# Patient Record
Sex: Male | Born: 1960 | Race: White | Hispanic: No | Marital: Married | Smoking: Never smoker
Health system: Southern US, Community
[De-identification: ages and names within clinical notes are randomized; demographics above are authoritative.]

## PROBLEM LIST (undated history)

## (undated) DIAGNOSIS — K219 Gastro-esophageal reflux disease without esophagitis: Secondary | ICD-10-CM

## (undated) DIAGNOSIS — M1990 Unspecified osteoarthritis, unspecified site: Secondary | ICD-10-CM

## (undated) DIAGNOSIS — R7303 Prediabetes: Secondary | ICD-10-CM

## (undated) DIAGNOSIS — G4730 Sleep apnea, unspecified: Secondary | ICD-10-CM

## (undated) DIAGNOSIS — E785 Hyperlipidemia, unspecified: Secondary | ICD-10-CM

## (undated) HISTORY — DX: Hyperlipidemia, unspecified: E78.5

## (undated) HISTORY — PX: OTHER SURGICAL HISTORY: SHX169

---

## 2009-07-03 ENCOUNTER — Ambulatory Visit: Payer: Self-pay | Admitting: Otolaryngology

## 2010-08-18 ENCOUNTER — Ambulatory Visit: Payer: Self-pay | Admitting: Family Medicine

## 2012-01-07 IMAGING — US SCREENING ULTRASOUND OF ABDOMINAL AORTA
1 series · 18 of 21 positions shown · non-contrast
Comparison: none

REASON FOR EXAM: family history of AAA
COMMENTS:

PROCEDURE:     WECHTETI - WECHTETI EXAM AAA SCREENING  - August 18, 2010  [DATE]
RESULT:     The abdominal aorta is visualized and is normal in caliber. No
aneurysm formation is seen. The common iliac arteries are normal in
appearance.

[Series 1: screening ultrasound of abdominal aorta · 18 of 21 slices shown]
[im 1/21]
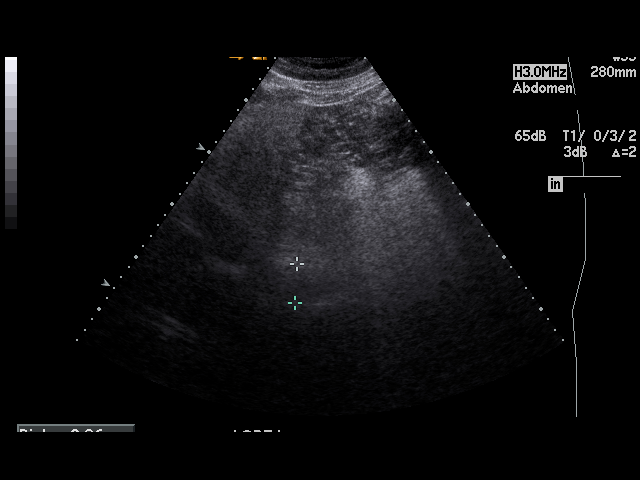
[im 2/21]
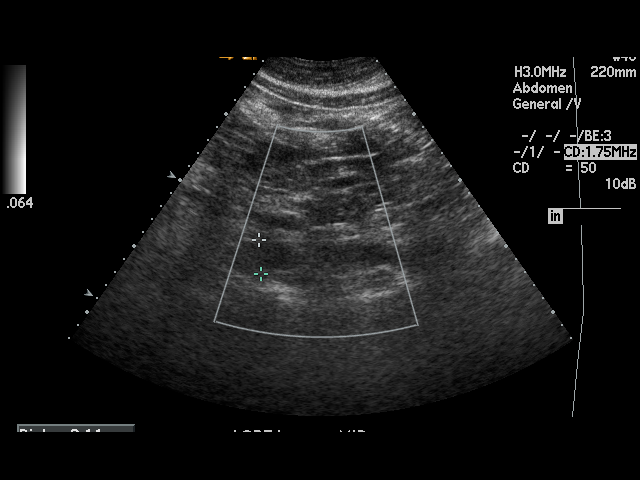
[im 3/21]
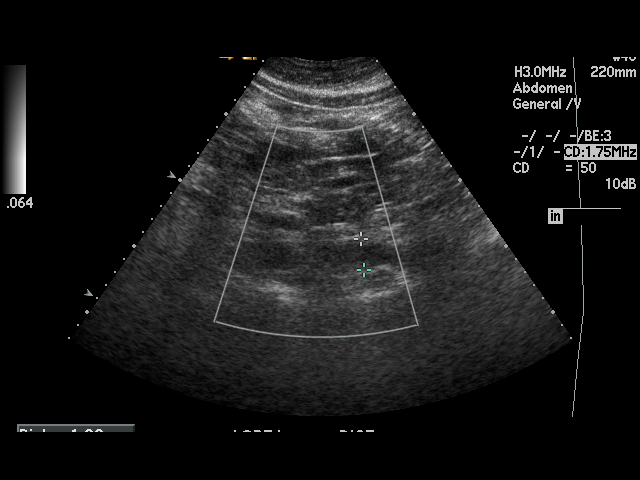
[im 5/21]
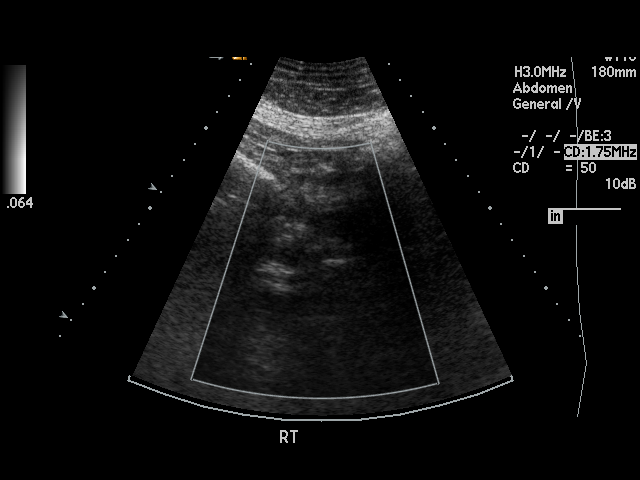
[im 6/21]
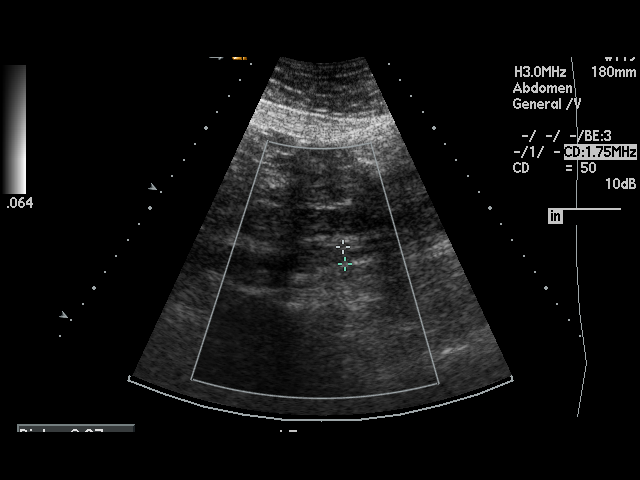
[im 7/21]
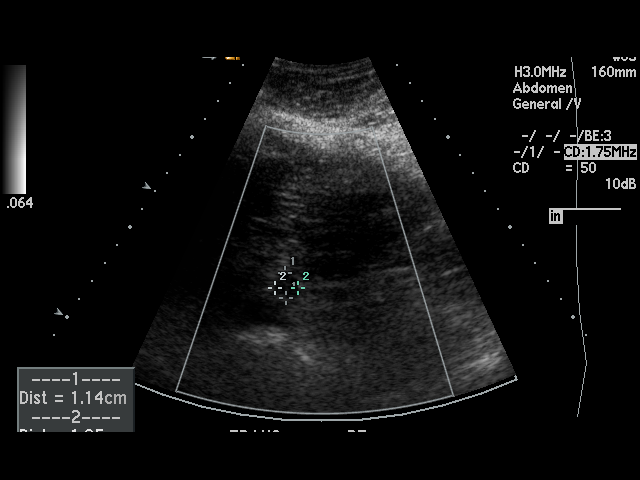
[im 8/21]
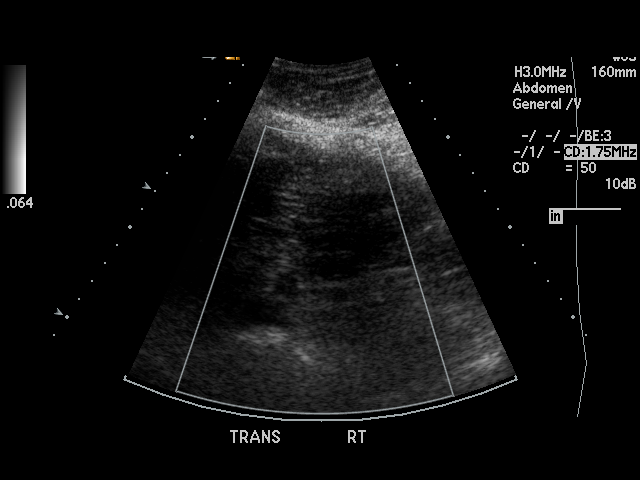
[im 9/21]
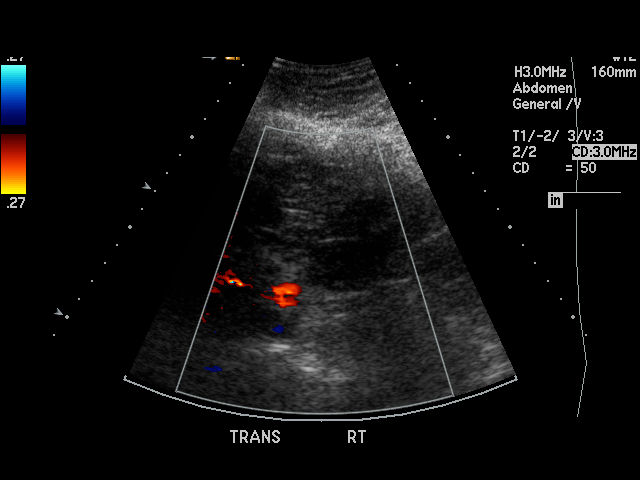
[im 10/21]
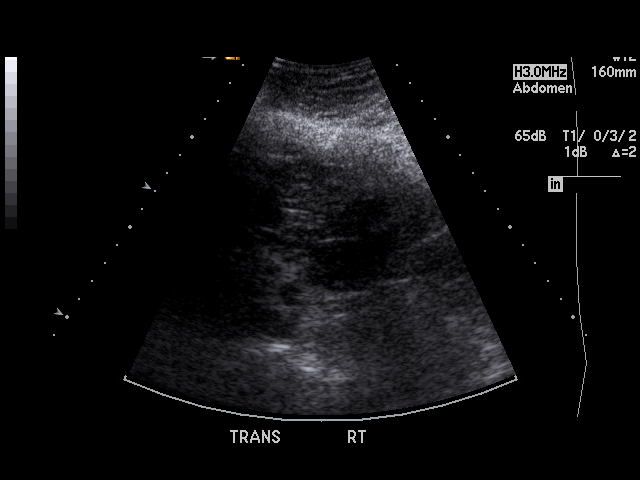
[im 12/21]
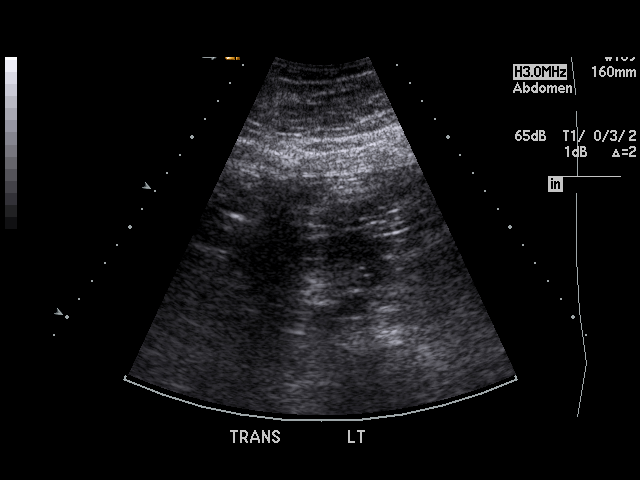
[im 13/21]
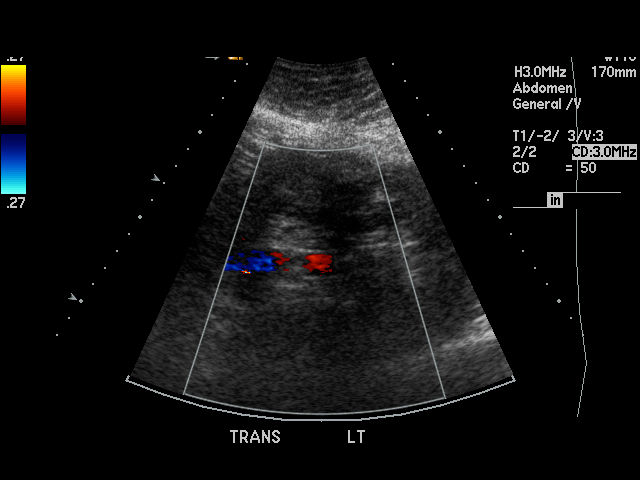
[im 14/21]
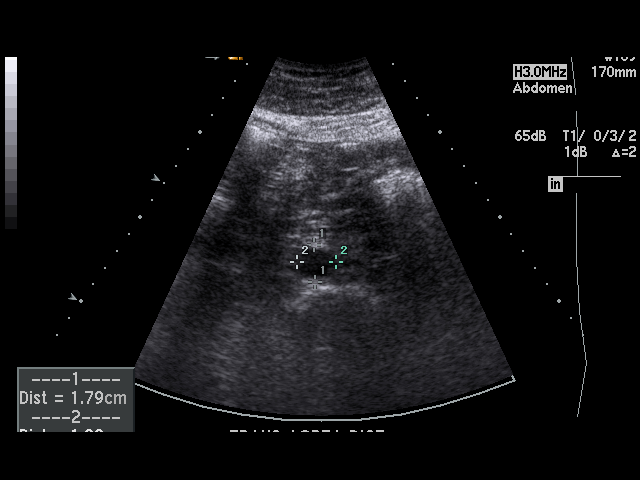
[im 15/21]
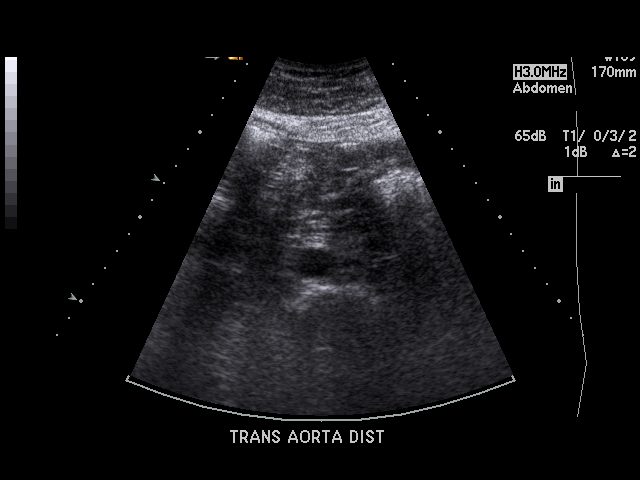
[im 16/21]
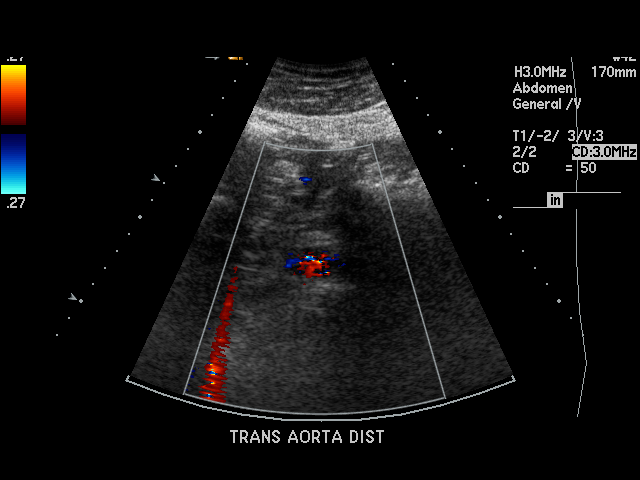
[im 17/21]
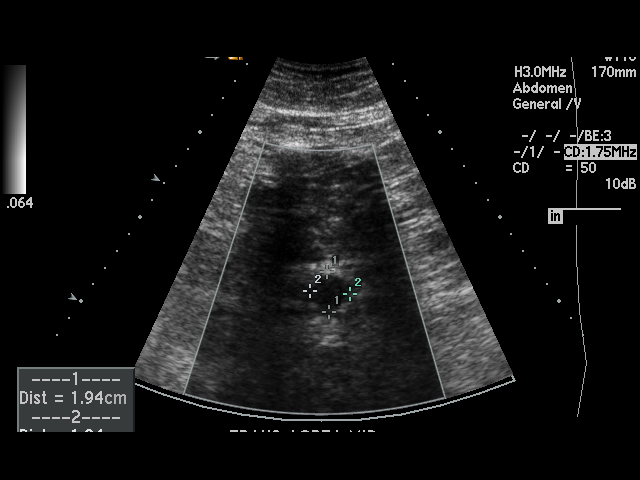
[im 19/21]
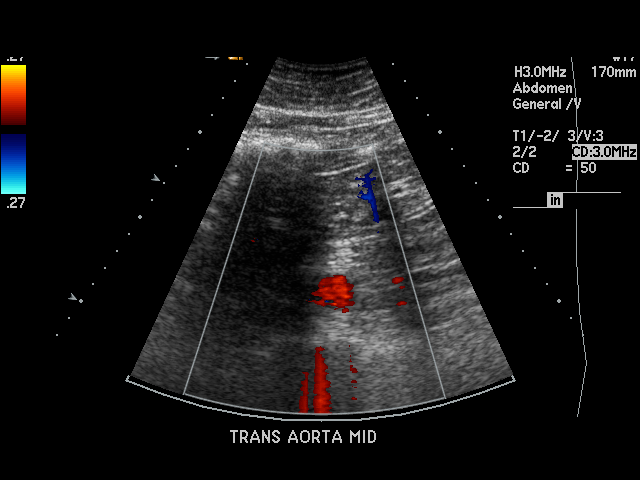
[im 20/21]
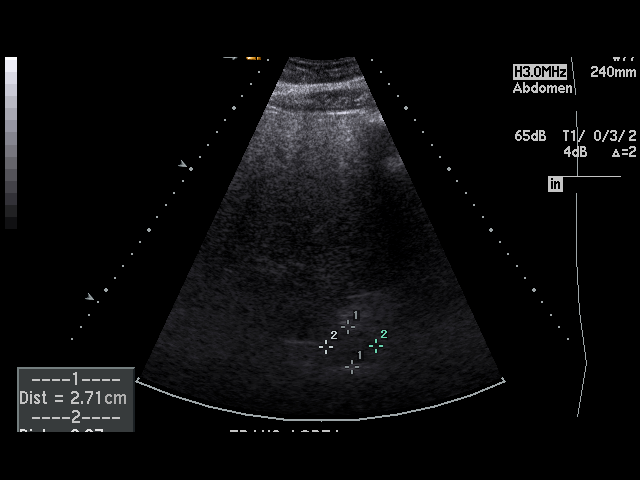
[im 21/21]
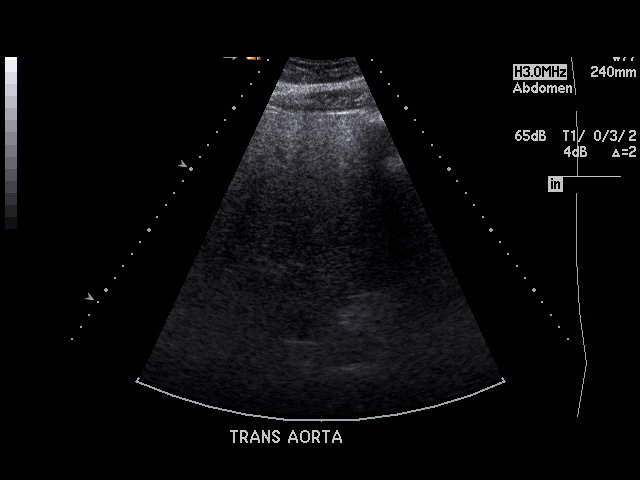

[18 of 21 positions shown; findings below may reference images not displayed]

IMPRESSION: No aneurysm or other significant abnormalities identified.

## 2013-08-16 DIAGNOSIS — R972 Elevated prostate specific antigen [PSA]: Secondary | ICD-10-CM | POA: Insufficient documentation

## 2013-08-18 DIAGNOSIS — E291 Testicular hypofunction: Secondary | ICD-10-CM | POA: Insufficient documentation

## 2013-08-18 DIAGNOSIS — N400 Enlarged prostate without lower urinary tract symptoms: Secondary | ICD-10-CM | POA: Insufficient documentation

## 2014-11-11 ENCOUNTER — Other Ambulatory Visit: Payer: Self-pay | Admitting: Physician Assistant

## 2014-11-11 ENCOUNTER — Ambulatory Visit
Admission: RE | Admit: 2014-11-11 | Discharge: 2014-11-11 | Disposition: A | Discharge status: Home / Self Care | Payer: BLUE CROSS/BLUE SHIELD | Source: Ambulatory Visit | Attending: Physician Assistant | Admitting: Physician Assistant

## 2014-11-11 DIAGNOSIS — R05 Cough: Secondary | ICD-10-CM

## 2014-11-11 DIAGNOSIS — R059 Cough, unspecified: Secondary | ICD-10-CM

## 2015-08-10 ENCOUNTER — Other Ambulatory Visit: Payer: Self-pay | Admitting: Physician Assistant

## 2015-09-12 ENCOUNTER — Ambulatory Visit
Admission: RE | Admit: 2015-09-12 | Discharge: 2015-09-12 | Disposition: A | Discharge status: Home / Self Care | Payer: BLUE CROSS/BLUE SHIELD | Source: Ambulatory Visit | Attending: Family Medicine | Admitting: Family Medicine

## 2015-09-12 ENCOUNTER — Other Ambulatory Visit: Payer: Self-pay | Admitting: Family Medicine

## 2015-09-12 DIAGNOSIS — M19011 Primary osteoarthritis, right shoulder: Secondary | ICD-10-CM | POA: Insufficient documentation

## 2015-09-12 DIAGNOSIS — M25511 Pain in right shoulder: Secondary | ICD-10-CM

## 2016-02-13 ENCOUNTER — Encounter: Payer: Self-pay | Admitting: *Deleted

## 2016-02-16 ENCOUNTER — Ambulatory Visit: Payer: BLUE CROSS/BLUE SHIELD | Admitting: Anesthesiology

## 2016-02-16 ENCOUNTER — Ambulatory Visit
Admission: RE | Admit: 2016-02-16 | Discharge: 2016-02-16 | Disposition: A | Discharge status: Home / Self Care | Payer: BLUE CROSS/BLUE SHIELD | Source: Ambulatory Visit | Attending: Gastroenterology | Admitting: Gastroenterology

## 2016-02-16 ENCOUNTER — Encounter: Payer: Self-pay | Admitting: *Deleted

## 2016-02-16 ENCOUNTER — Encounter
Admission: RE | Disposition: A | Discharge status: Home / Self Care | Payer: Self-pay | Source: Ambulatory Visit | Attending: Gastroenterology

## 2016-02-16 DIAGNOSIS — K5730 Diverticulosis of large intestine without perforation or abscess without bleeding: Secondary | ICD-10-CM | POA: Diagnosis not present

## 2016-02-16 DIAGNOSIS — K640 First degree hemorrhoids: Secondary | ICD-10-CM | POA: Insufficient documentation

## 2016-02-16 DIAGNOSIS — Z79899 Other long term (current) drug therapy: Secondary | ICD-10-CM | POA: Insufficient documentation

## 2016-02-16 DIAGNOSIS — Z7982 Long term (current) use of aspirin: Secondary | ICD-10-CM | POA: Diagnosis not present

## 2016-02-16 DIAGNOSIS — G4730 Sleep apnea, unspecified: Secondary | ICD-10-CM | POA: Insufficient documentation

## 2016-02-16 DIAGNOSIS — K219 Gastro-esophageal reflux disease without esophagitis: Secondary | ICD-10-CM | POA: Insufficient documentation

## 2016-02-16 DIAGNOSIS — Z1211 Encounter for screening for malignant neoplasm of colon: Secondary | ICD-10-CM | POA: Diagnosis present

## 2016-02-16 HISTORY — DX: Sleep apnea, unspecified: G47.30

## 2016-02-16 HISTORY — DX: Gastro-esophageal reflux disease without esophagitis: K21.9

## 2016-02-16 HISTORY — PX: COLONOSCOPY WITH PROPOFOL: SHX5780

## 2016-02-16 SURGERY — COLONOSCOPY WITH PROPOFOL
Anesthesia: General

## 2016-02-16 MED ORDER — PROPOFOL 500 MG/50ML IV EMUL
INTRAVENOUS | Status: DC | PRN
  Administered 2016-02-16: 125 ug/kg/min via INTRAVENOUS

## 2016-02-16 MED ORDER — MIDAZOLAM HCL 2 MG/2ML IJ SOLN
INTRAMUSCULAR | Status: DC | PRN
  Administered 2016-02-16: 1 mg via INTRAVENOUS

## 2016-02-16 MED ORDER — PROPOFOL 10 MG/ML IV BOLUS
INTRAVENOUS | Status: DC | PRN
  Administered 2016-02-16: 10 mg via INTRAVENOUS
  Administered 2016-02-16: 40 mg via INTRAVENOUS
  Administered 2016-02-16: 50 mg via INTRAVENOUS

## 2016-02-16 MED ORDER — SODIUM CHLORIDE 0.9 % IV SOLN
INTRAVENOUS | Status: DC
  Administered 2016-02-16: 1000 mL via INTRAVENOUS

## 2016-02-16 MED ORDER — SODIUM CHLORIDE 0.9 % IV SOLN: INTRAVENOUS | Status: DC

## 2016-02-16 NOTE — Transfer of Care (Signed)
Immediate Anesthesia Transfer of Care Note  Patient: Jonathon Rennis PettyO Balash Jr.  Procedure(s) Performed: Procedure(s): COLONOSCOPY WITH PROPOFOL (N/A)  Patient Location: PACU and Endoscopy Unit  Anesthesia Type:General  Level of Consciousness: awake, alert  and oriented  Airway & Oxygen Therapy: Patient Spontanous Breathing and Patient connected to nasal cannula oxygen  Post-op Assessment: Report given to RN and Post -op Vital signs reviewed and stable  Post vital signs: Reviewed and stable  Last Vitals:  Vitals:   02/16/16 1618 02/16/16 1619  BP: 114/67 114/67  Pulse: 90 89  Resp: 16 (!) 22  Temp: (!) 35.9 C     Last Pain:  Vitals:   02/16/16 1618  TempSrc: Tympanic         Complications: No apparent anesthesia complications

## 2016-02-16 NOTE — H&P (Signed)
Outpatient short stay form Pre-procedure 02/16/2016 3:45 PM Jonathon Evans Jonathon Knock MD  Primary Physician: Dr. Tarri AbernethyJoseph Rabinowitz  Reason for visit:  Screening colonoscopy  History of present illness:  Patient is a 55 year old male presenting today for colonoscopy. His last one was in 2003 with negative findings. He does take a daily 81 mg aspirin but has held that for several days. He takes no other aspirin products or blood thinning agents.    Current Facility-Administered Medications:  .  0.9 %  sodium chloride infusion, , Intravenous, Continuous, Jonathon Evans Cesare Sumlin, MD, Last Rate: 20 mL/hr at 02/16/16 1356, 1,000 mL at 02/16/16 1356 .  0.9 %  sodium chloride infusion, , Intravenous, Continuous, Jonathon Evans Lavaun Greenfield, MD  Prescriptions Prior to Admission  Medication Sig Dispense Refill Last Dose  . allopurinol (ZYLOPRIM) 300 MG tablet Take 300 mg by mouth daily.   02/15/2016 at Unknown time  . aspirin EC 81 MG tablet Take 81 mg by mouth daily.   Past Week at Unknown time  . Cholecalciferol (VITAMIN D3) 2000 units TABS Take by mouth.   Past Week at Unknown time  . GLUCOSAMINE-FISH OIL-EPA-DHA PO Take by mouth.   Past Week at Unknown time  . meloxicam (MOBIC) 15 MG tablet Take 15 mg by mouth daily.   Past Week at Unknown time  . Multiple Vitamin (MULTIVITAMIN) tablet Take 1 tablet by mouth daily.   Past Week at Unknown time  . omeprazole (PRILOSEC) 20 MG capsule Take 20 mg by mouth daily.   02/15/2016 at Unknown time  . rosuvastatin (CRESTOR) 10 MG tablet Take 10 mg by mouth daily.   02/15/2016 at Unknown time  . vitamin B-12 (CYANOCOBALAMIN) 1000 MCG tablet Take 1,000 mcg by mouth daily.   Past Week at Unknown time     No Known Allergies   Past Medical History:  Diagnosis Date  . GERD (gastroesophageal reflux disease)   . Sleep apnea     Review of systems:      Physical Exam    Heart and lungs: Regular rate and rhythm without rub or gallop, lungs are bilaterally clear.    HEENT:  Normocephalic atraumatic eyes are anicteric    Other:     Pertinant exam for procedure: Soft nontender nondistended bowel sounds positive normoactive.     Jonathon DeemSKULSKIE, [name], MD  3:46 PM 02/16/2016    Jonathon Evans [name], MD Gastroenterology 02/16/2016  3:45 PM

## 2016-02-16 NOTE — Anesthesia Preprocedure Evaluation (Signed)
Anesthesia Evaluation  Patient identified by MRN, date of birth, ID band Patient awake    Reviewed: Allergy & Precautions, NPO status , Patient's Chart, lab work & pertinent test results  History of Anesthesia Complications Negative for: history of anesthetic complications  Airway Mallampati: II       Dental   Pulmonary sleep apnea and Continuous Positive Airway Pressure Ventilation ,           Cardiovascular negative cardio ROS       Neuro/Psych    GI/Hepatic Neg liver ROS, GERD  Medicated and Controlled,  Endo/Other  negative endocrine ROS  Renal/GU negative Renal ROS     Musculoskeletal   Abdominal   Peds  Hematology negative hematology ROS (+)   Anesthesia Other Findings   Reproductive/Obstetrics                             Anesthesia Physical Anesthesia Plan  ASA: III  Anesthesia Plan: General   Post-op Pain Management:    Induction: Intravenous  Airway Management Planned: Nasal Cannula  Additional Equipment:   Intra-op Plan:   Post-operative Plan:   Informed Consent: I have reviewed the patients History and Physical, chart, labs and discussed the procedure including the risks, benefits and alternatives for the proposed anesthesia with the patient or authorized representative who has indicated his/her understanding and acceptance.     Plan Discussed with:   Anesthesia Plan Comments:         Anesthesia Quick Evaluation

## 2016-02-16 NOTE — Anesthesia Postprocedure Evaluation (Signed)
Anesthesia Post Note  Patient: Jonathon Rennis PettyO Thielman Jr.  Procedure(s) Performed: Procedure(s) (LRB): COLONOSCOPY WITH PROPOFOL (N/A)  Patient location during evaluation: Endoscopy Anesthesia Type: General Level of consciousness: awake and alert Pain management: pain level controlled Vital Signs Assessment: post-procedure vital signs reviewed and stable Respiratory status: spontaneous breathing, nonlabored ventilation, respiratory function stable and patient connected to nasal cannula oxygen Cardiovascular status: blood pressure returned to baseline and stable Postop Assessment: no signs of nausea or vomiting Anesthetic complications: no    Last Vitals:  Vitals:   02/16/16 1628 02/16/16 1638  BP: 97/76 116/90  Pulse: 77 71  Resp: 19 (!) 21  Temp:      Last Pain:  Vitals:   02/16/16 1618  TempSrc: Tympanic                 [name]

## 2016-02-16 NOTE — Op Note (Signed)
[facility] Gastroenterology Patient Name: Jonathon BodoRex Rhine Procedure Date: 02/16/2016 3:42 PM MRN: [id number] Account #: [id number] Date of Birth: 10/13/1960 Admit Type: Outpatient Age: [age] Room: [facility] ENDO ROOM 1 Gender: Male Note Status: Finalized Procedure:            Colonoscopy Indications:          Screening for colorectal malignant neoplasm Providers:            Christena DeemMartin U. Skulskie, MD Referring MD:         No Local Md, MD (Referring MD) Medicines:            Monitored Anesthesia Care Complications:        No immediate complications. Procedure:            Pre-Anesthesia Assessment:                       - ASA Grade Assessment: III - A patient with severe                        systemic disease.                       After obtaining informed consent, the colonoscope was                        passed under direct vision. Throughout the procedure,                        the patient's blood pressure, pulse, and oxygen                        saturations were monitored continuously. The                        Colonoscope was introduced through the anus and                        advanced to the the cecum, identified by appendiceal                        orifice and ileocecal valve. The colonoscopy was                        performed without difficulty. The patient tolerated the                        procedure well. The quality of the bowel preparation                        was good. Findings:      A few small-mouthed diverticula were found in the sigmoid colon.      Non-bleeding internal hemorrhoids were found during retroflexion and       during anoscopy. The hemorrhoids were small and Grade I (internal       hemorrhoids that do not prolapse).      No additional abnormalities were found on retroflexion.      The digital rectal exam was normal. Impression:           - Diverticulosis in the sigmoid colon.                       -  Non-bleeding internal hemorrhoids.                      - No specimens collected. Recommendation:       - Discharge patient to home.                       - Advance diet as tolerated.                       - Repeat colonoscopy in 10 years for screening purposes. Procedure Code(s):    --- Professional ---                       [id number], Colonoscopy, flexible; diagnostic, including                        collection of specimen(s) by brushing or washing, when                        performed (separate procedure) Diagnosis Code(s):    --- Professional ---                       Z12.11, Encounter for screening for malignant neoplasm                        of colon                       K64.0, First degree hemorrhoids                       K57.30, Diverticulosis of large intestine without                        perforation or abscess without bleeding CPT copyright 2016 American Medical Association. All rights reserved. The codes documented in this report are preliminary and upon coder review may  be revised to meet current compliance requirements. Christena DeemMartin U Skulskie, MD 02/16/2016 4:15:00 PM This report has been signed electronically. Number of Addenda: 0 Note Initiated On: 02/16/2016 3:42 PM Scope Withdrawal Time: 0 hours 10 minutes 13 seconds  Total Procedure Duration: 0 hours 21 minutes 16 seconds       [facility]

## 2016-02-17 ENCOUNTER — Encounter: Payer: Self-pay | Admitting: Gastroenterology

## 2017-01-31 IMAGING — CR DG SHOULDER 2+V*R*
1 series · 3 of 3 positions shown · non-contrast
Comparison: None.

CLINICAL DATA: Right shoulder pain for 6 months with no known
injury

EXAM:
RIGHT SHOULDER - 2+ VIEW

[Series 1: dg shoulder right · 0.14mm/px · 3 of 3 slices shown]
[im 1/3]
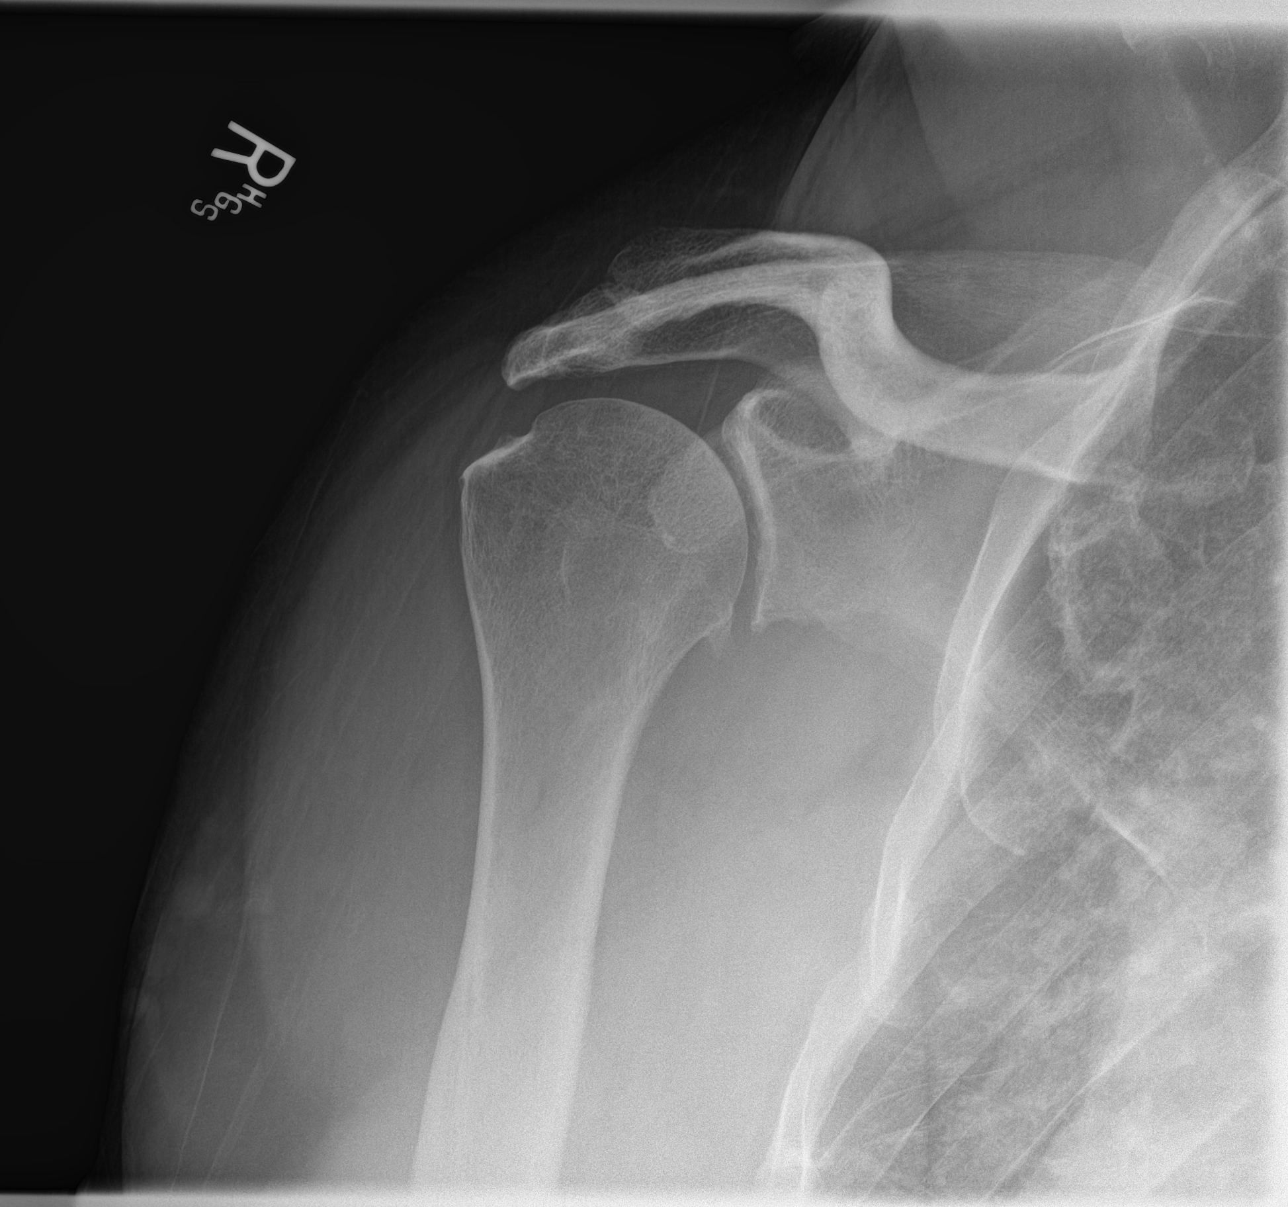
[im 2/3]
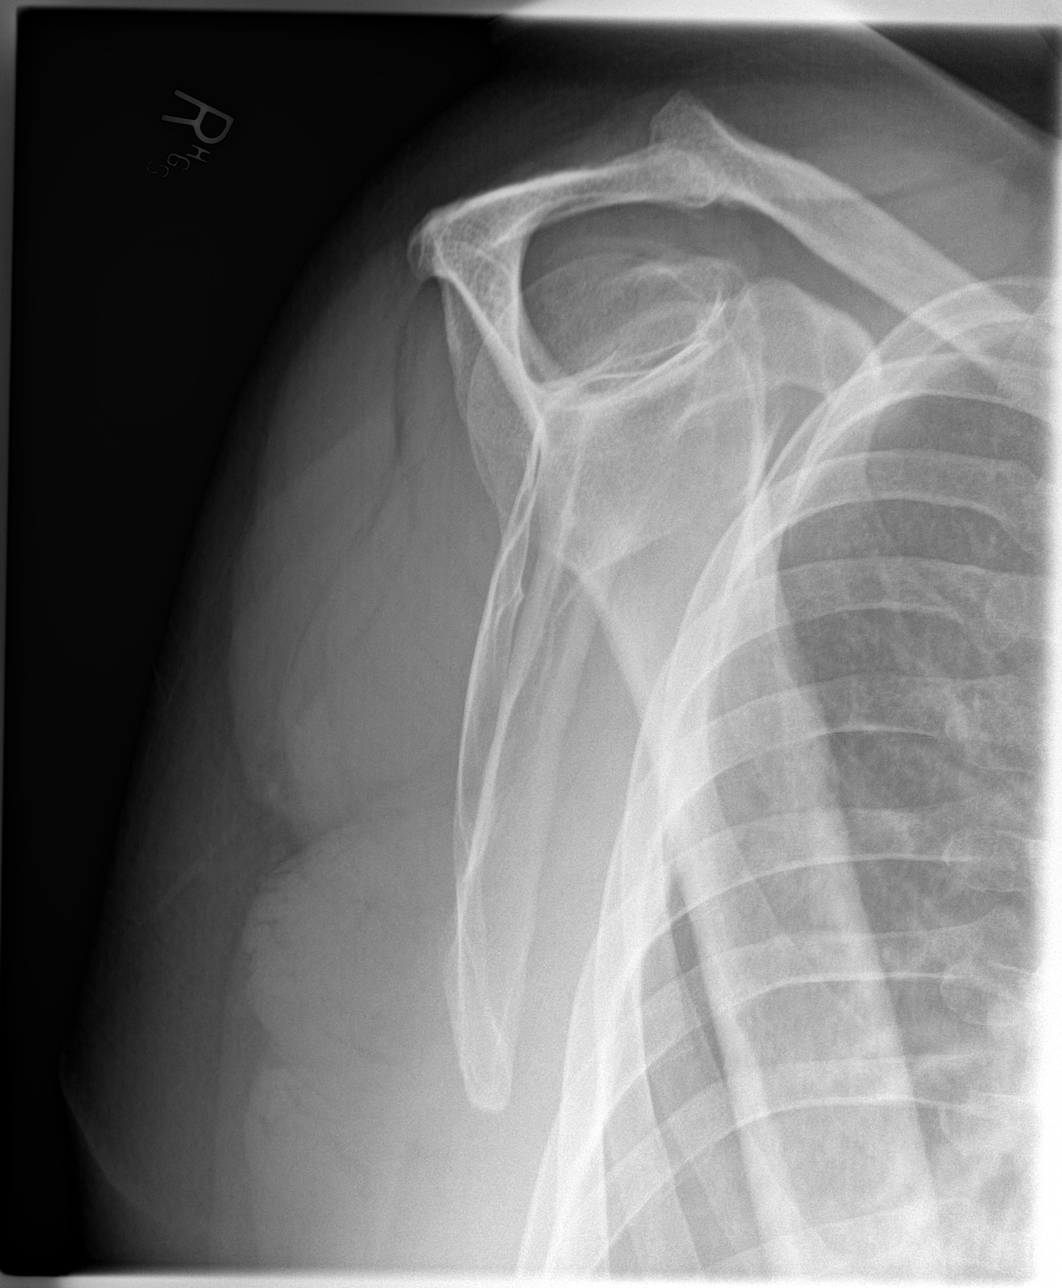
[im 3/3]
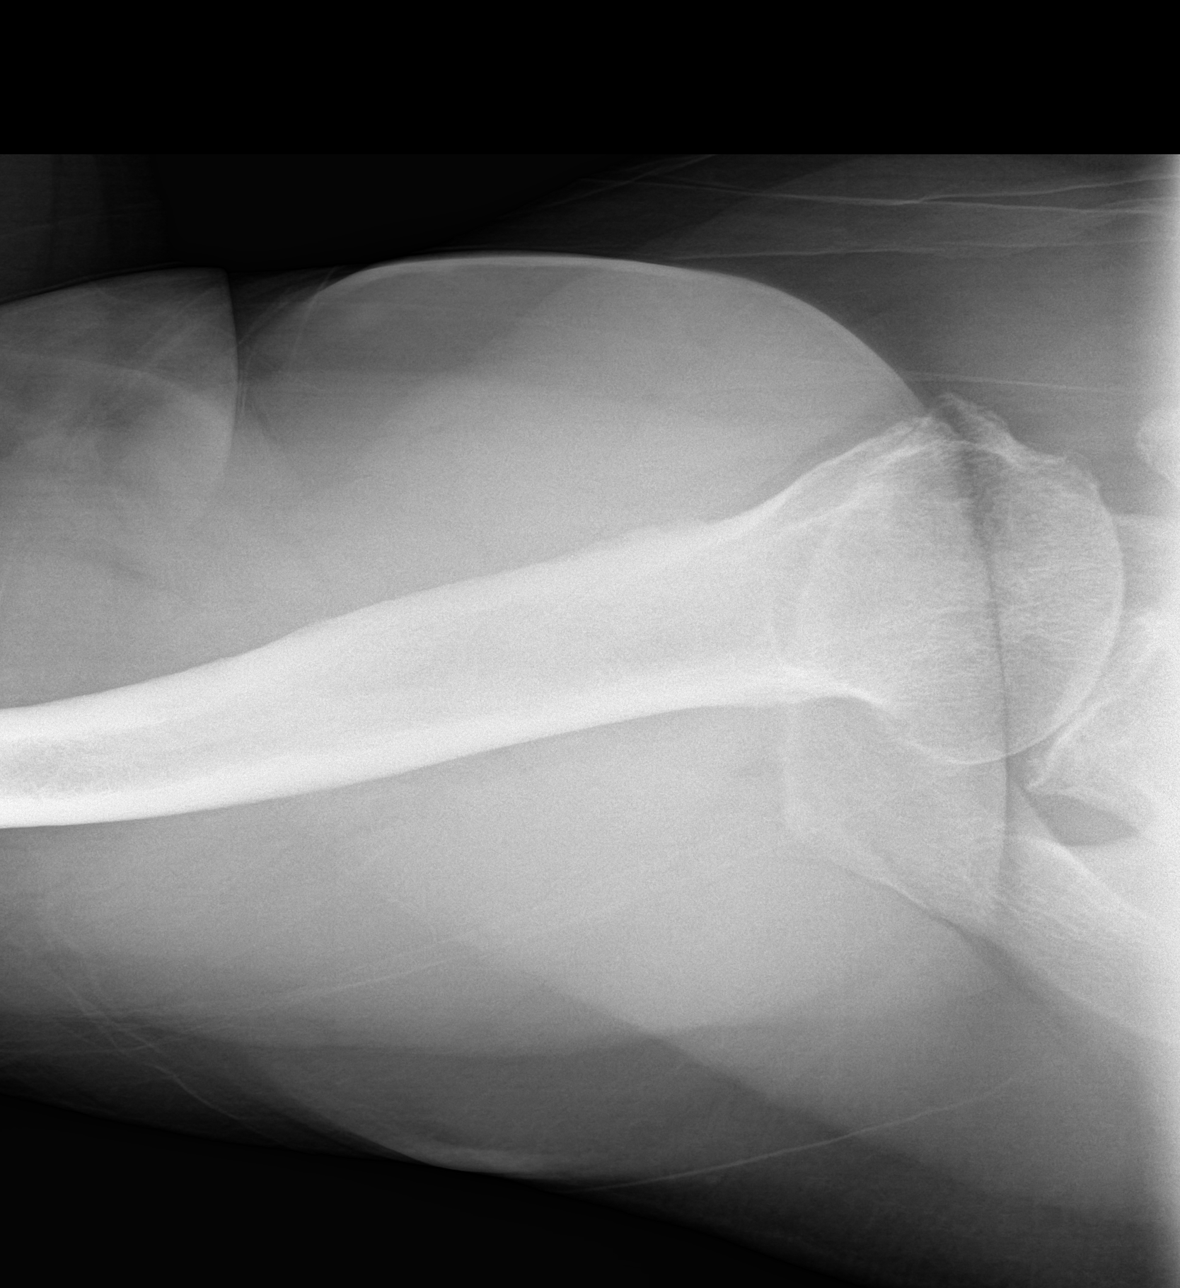

[3 of 3 positions shown; findings below may reference images not displayed]

FINDINGS: Narrowing with osteophyte formation of the glenohumeral joint. No
fracture or dislocation. Mild acromioclavicular joint arthropathy.
IMPRESSION: Glenohumeral arthritis with no acute findings

## 2018-10-28 ENCOUNTER — Other Ambulatory Visit: Payer: Self-pay

## 2018-10-28 ENCOUNTER — Ambulatory Visit
Admission: EM | Admit: 2018-10-28 | Discharge: 2018-10-28 | Disposition: A | Discharge status: Home / Self Care | Payer: 59 | Attending: Family Medicine | Admitting: Family Medicine

## 2018-10-28 DIAGNOSIS — Z20822 Contact with and (suspected) exposure to covid-19: Secondary | ICD-10-CM

## 2018-10-28 DIAGNOSIS — Z20828 Contact with and (suspected) exposure to other viral communicable diseases: Secondary | ICD-10-CM

## 2018-10-28 NOTE — Discharge Instructions (Addendum)
It was very nice seeing you today in clinic. Thank you for entrusting me with your care.   Testing collected today for SARS-CoV-2 (novel coronavirus). Results have been taking as long as 7 days. Someone will contact you with the results. You should self quarantine, per [place] DHSS guidelines, until negative test results have been received.   Make arrangements to follow up with your regular doctor if you develop any concerning symptoms. Please remember, our [place] providers are "right here with you" when you need [place].   Again, it was my pleasure to take care of you today. Thank you for choosing our clinic. I hope that you start to feel better quickly.   Honor Loh, MSN, APRN, FNP-C, CEN Advanced Practice Provider [facility]

## 2018-10-28 NOTE — ED Provider Notes (Signed)
[place], [place]   Name: Jonathon Jonathon PettyO Kostelnik Jr. DOB: [birth year] MRN: [id number] CSN: [id number] PCP: Jonathon Chiquitoabinowitz, Joseph H, MD  Arrival date and time:  10/28/18 1349  Chief Complaint:  COVID testing   NOTE: Prior to seeing the patient today, I have reviewed the triage nursing documentation and vital signs. Clinical staff has updated patient's PMH/PSHx, current medication list, and drug allergies/intolerances to ensure comprehensive history available to assist in medical decision making.   History:   HPI: [place] Jonathon PettyO Schulke Jr. is a 58 y.o. male who presents today with complaints of recent direct exposure to SARS-CoV-2 (novel coronavirus). Known exposure reported to have occurred on/around 10/24/2018. Patient advises that the person he was exposed to was a co-worker [employer] [place] Patient presents today with no symptoms; no cough, fevers, or other symptoms commonly associated with SARS-CoV-2. He advises that he feels generally well. Patient presents for testing out of concern for his personal health. He adds that he is is being required to provide documentation of negative test results before he will be allowed to return to work.    Past Medical History:  Diagnosis Date  . GERD (gastroesophageal reflux disease)   . Sleep apnea     Past Surgical History:  Procedure Laterality Date  . colonoscopy and upper endoscopy    . COLONOSCOPY WITH PROPOFOL N/A 02/16/2016   Procedure: COLONOSCOPY WITH PROPOFOL;  Surgeon: Jonathon DeemMartin U Skulskie, MD;  Location: [facility] ENDOSCOPY;  Service: Endoscopy;  Laterality: N/A;    History reviewed. No pertinent family history.  Social History   Tobacco Use  . Smoking status: Never Smoker  . Smokeless tobacco: Never Used  Substance Use Topics  . Alcohol use: No  . Drug use: No    There are no active problems to display for this patient.   Home Medications:    No outpatient medications have been marked as taking for the 10/28/18 encounter [facility](Hospital Encounter).     Allergies:   Patient has no known allergies.  Review of Systems (ROS): Review of Systems  Constitutional: Negative for fatigue and fever.  HENT: Negative for congestion, ear pain, postnasal drip, rhinorrhea, sinus pressure, sinus pain, sneezing and sore throat.   Eyes: Negative for pain, discharge and redness.  Respiratory: Negative for cough, chest tightness and shortness of breath.   Cardiovascular: Negative for chest pain and palpitations.  Gastrointestinal: Negative for abdominal pain, diarrhea, nausea and vomiting.  Musculoskeletal: Negative for arthralgias, back pain, myalgias and neck pain.  Skin: Negative for color change, pallor and rash.  Neurological: Negative for dizziness, syncope, weakness and headaches.  Hematological: Negative for adenopathy.     Vital Signs: Today's Vitals   10/28/18 1401 10/28/18 1404  BP: (!) 146/102   Pulse: 97   Resp: 18   Temp: 98.2 F (36.8 C)   TempSrc: Oral   SpO2: 98%   Weight:  (!) 330 lb (149.7 kg)  Height:  6' (1.829 m)  PainSc:  0-No pain    Physical Exam: Physical Exam  Constitutional: He is oriented to person, place, and time and well-developed, well-nourished, and in no distress.  HENT:  Head: Normocephalic and atraumatic.  Nose: Nose normal.  Mouth/Throat: Oropharynx is clear and moist and mucous membranes are normal.  Eyes: Pupils are equal, round, and reactive to light. EOM are normal.  Cardiovascular: Normal rate, regular rhythm, normal heart sounds and intact distal pulses. Exam reveals no gallop and no friction rub.  No murmur heard. Pulmonary/Chest: Effort normal and breath sounds  normal. No respiratory distress. He has no wheezes. He has no rales.  Neurological: He is alert and oriented to person, place, and time. Gait normal. GCS score is 15.  Skin: Skin is warm and dry. No rash noted.  Psychiatric: Mood, memory, affect and judgment normal.  Nursing note and vitals reviewed.   Urgent Care Treatments /  Results:   LABS: PLEASE NOTE: all labs that were ordered this encounter are listed, however only abnormal results are displayed. Labs Reviewed  NOVEL CORONAVIRUS, NAA (HOSPITAL ORDER, SEND-OUT TO REF LAB)    EKG: -None  RADIOLOGY: No results found.  PROCEDURES: Procedures  MEDICATIONS RECEIVED THIS VISIT: Medications - No data to display  PERTINENT CLINICAL COURSE NOTES/UPDATES:   Initial Impression / Assessment and Plan / Urgent Care Course:  Pertinent labs & imaging results that were available during my care of the patient were personally reviewed by me and considered in my medical decision making (see lab/imaging section of note for values and interpretations).  Jonathon Jonathon PettyO Lollis Jr. is a 58 y.o. male who [facility] today with complaints of COVID testing   Patient is well appearing overall in clinic today. He does not appear to be in any acute distress. Presenting symptoms (see HPI) and exam as documented above. Patient overall well appearing and in no acute distress today in clinic. Presenting symptoms (see HPI) and exam as documented above. He presents following a direct exposure to SARS-CoV-2 (novel coronavirus). Discussed typical symptom constellation. Reviewed potential for infection with recent close contact. Given exposure and potential for infection, testing is reasonable. Patient collected SARS-CoV-2 via facility approved self swab process today under the supervision of certified clinical staff. Discussed variable turn around times associated with testing, as swabs are being processed at [facility], and have been taking as long as 7 days. He was advised to self quarantine, per [facility] DHHS guidelines, until negative results received.    Discussed follow up with primary care physician should he develop any concerning symptoms. I have reviewed the follow up and strict return precautions for any new or worsening symptoms. Patient is aware of symptoms that would be deemed  urgent/emergent, and would thus require further evaluation either here or in the emergency department. At the time of discharge, he verbalized understanding and consent with the discharge plan as it was reviewed with him. All questions were fielded by provider and/or clinic staff prior to patient discharge.    Final Clinical Impressions / Urgent Care Diagnoses:   Final diagnoses:  Exposure to Covid-19 Virus    New Prescriptions:   Controlled Substance Registry consulted? Not Applicable  No orders of the defined types were placed in this encounter.   Recommended Follow up Care:  Patient encouraged to follow up with the following provider within the specified time frame, or sooner as dictated by the severity of his symptoms. As always, he was instructed that for any urgent/emergent care needs, he should seek care either here or in the emergency department for more immediate evaluation.  Follow-up Information    Jonathon Chiquitoabinowitz, Joseph H, MD.   Specialty: Family Medicine Why: As needed if your develop any concerning symptoms. Contact information: [address] [place] [id number] [phone]         NOTE: This note was prepared using Dragon dictation software along with smaller phrase technology. Despite my best ability to proofread, there is the potential that transcriptional errors may still occur from this process, and are completely unintentional.  Karen Kitchens, NP 10/29/18 [id number]

## 2018-10-28 NOTE — ED Triage Notes (Signed)
Pt reports he works for [employer] and an employee tested positive for [place] beginning of this week. No sx. Pt needs to be tested before returning to work

## 2018-10-30 ENCOUNTER — Other Ambulatory Visit: Payer: Self-pay

## 2018-10-30 LAB — NOVEL CORONAVIRUS, NAA (HOSP ORDER, SEND-OUT TO REF LAB; TAT 18-24 HRS): SARS-CoV-2, NAA: NOT DETECTED

## 2018-10-30 MED ORDER — MELOXICAM 15 MG PO TABS: 15.0000 mg | ORAL_TABLET | Freq: Every day | ORAL | 3 refills | Status: DC

## 2018-12-07 ENCOUNTER — Other Ambulatory Visit: Payer: Self-pay

## 2018-12-07 MED ORDER — OMEPRAZOLE 20 MG PO CPDR: 20.0000 mg | DELAYED_RELEASE_CAPSULE | Freq: Every day | ORAL | 1 refills | Status: DC

## 2018-12-29 DIAGNOSIS — H43811 Vitreous degeneration, right eye: Secondary | ICD-10-CM | POA: Diagnosis not present

## 2019-01-01 DIAGNOSIS — G4733 Obstructive sleep apnea (adult) (pediatric): Secondary | ICD-10-CM | POA: Diagnosis not present

## 2019-01-11 ENCOUNTER — Ambulatory Visit: Payer: Self-pay

## 2019-01-11 DIAGNOSIS — Z23 Encounter for immunization: Secondary | ICD-10-CM

## 2019-01-23 DIAGNOSIS — H43811 Vitreous degeneration, right eye: Secondary | ICD-10-CM | POA: Diagnosis not present

## 2019-01-25 DIAGNOSIS — H25041 Posterior subcapsular polar age-related cataract, right eye: Secondary | ICD-10-CM | POA: Diagnosis not present

## 2019-01-25 DIAGNOSIS — G4733 Obstructive sleep apnea (adult) (pediatric): Secondary | ICD-10-CM | POA: Diagnosis not present

## 2019-02-05 ENCOUNTER — Encounter: Payer: Self-pay | Admitting: *Deleted

## 2019-02-05 ENCOUNTER — Other Ambulatory Visit: Payer: Self-pay

## 2019-02-08 ENCOUNTER — Other Ambulatory Visit
Admission: RE | Admit: 2019-02-08 | Discharge: 2019-02-08 | Disposition: A | Discharge status: Home / Self Care | Payer: 59 | Source: Ambulatory Visit | Attending: Ophthalmology | Admitting: Ophthalmology

## 2019-02-08 DIAGNOSIS — Z20828 Contact with and (suspected) exposure to other viral communicable diseases: Secondary | ICD-10-CM | POA: Insufficient documentation

## 2019-02-08 DIAGNOSIS — Z01812 Encounter for preprocedural laboratory examination: Secondary | ICD-10-CM | POA: Insufficient documentation

## 2019-02-08 LAB — SARS CORONAVIRUS 2 (TAT 6-24 HRS): SARS Coronavirus 2: NEGATIVE

## 2019-02-08 NOTE — Discharge Instructions (Signed)

## 2019-02-12 ENCOUNTER — Ambulatory Visit: Payer: 59 | Admitting: Anesthesiology

## 2019-02-12 ENCOUNTER — Other Ambulatory Visit: Payer: Self-pay

## 2019-02-12 ENCOUNTER — Encounter
Admission: RE | Disposition: A | Discharge status: Home / Self Care | Payer: Self-pay | Source: Home / Self Care | Attending: Ophthalmology

## 2019-02-12 ENCOUNTER — Ambulatory Visit
Admission: RE | Admit: 2019-02-12 | Discharge: 2019-02-12 | Disposition: A | Discharge status: Home / Self Care | Payer: 59 | Attending: Ophthalmology | Admitting: Ophthalmology

## 2019-02-12 DIAGNOSIS — H25041 Posterior subcapsular polar age-related cataract, right eye: Secondary | ICD-10-CM | POA: Diagnosis not present

## 2019-02-12 DIAGNOSIS — Z6841 Body Mass Index (BMI) 40.0 and over, adult: Secondary | ICD-10-CM | POA: Insufficient documentation

## 2019-02-12 DIAGNOSIS — Z791 Long term (current) use of non-steroidal anti-inflammatories (NSAID): Secondary | ICD-10-CM | POA: Diagnosis not present

## 2019-02-12 DIAGNOSIS — M109 Gout, unspecified: Secondary | ICD-10-CM | POA: Insufficient documentation

## 2019-02-12 DIAGNOSIS — K219 Gastro-esophageal reflux disease without esophagitis: Secondary | ICD-10-CM | POA: Insufficient documentation

## 2019-02-12 DIAGNOSIS — H2511 Age-related nuclear cataract, right eye: Secondary | ICD-10-CM | POA: Diagnosis not present

## 2019-02-12 DIAGNOSIS — Z79899 Other long term (current) drug therapy: Secondary | ICD-10-CM | POA: Insufficient documentation

## 2019-02-12 DIAGNOSIS — G4730 Sleep apnea, unspecified: Secondary | ICD-10-CM | POA: Insufficient documentation

## 2019-02-12 DIAGNOSIS — E7800 Pure hypercholesterolemia, unspecified: Secondary | ICD-10-CM | POA: Insufficient documentation

## 2019-02-12 DIAGNOSIS — Z7982 Long term (current) use of aspirin: Secondary | ICD-10-CM | POA: Diagnosis not present

## 2019-02-12 DIAGNOSIS — H25811 Combined forms of age-related cataract, right eye: Secondary | ICD-10-CM | POA: Diagnosis not present

## 2019-02-12 HISTORY — PX: CATARACT EXTRACTION W/PHACO: SHX586

## 2019-02-12 HISTORY — DX: Unspecified osteoarthritis, unspecified site: M19.90

## 2019-02-12 SURGERY — PHACOEMULSIFICATION, CATARACT, WITH IOL INSERTION
Anesthesia: Monitor Anesthesia Care | Site: Eye | Laterality: Right

## 2019-02-12 MED ORDER — FENTANYL CITRATE (PF) 100 MCG/2ML IJ SOLN
INTRAMUSCULAR | Status: DC | PRN
  Administered 2019-02-12 (×2): 50 ug via INTRAVENOUS

## 2019-02-12 MED ORDER — LIDOCAINE HCL (PF) 2 % IJ SOLN
INTRAOCULAR | Status: DC | PRN
  Administered 2019-02-12: 2 mL via INTRAOCULAR

## 2019-02-12 MED ORDER — SODIUM HYALURONATE 23 MG/ML IO SOLN
INTRAOCULAR | Status: DC | PRN
  Administered 2019-02-12: 0.6 mL via INTRAOCULAR

## 2019-02-12 MED ORDER — LACTATED RINGERS IV SOLN: INTRAVENOUS | Status: DC

## 2019-02-12 MED ORDER — EPINEPHRINE PF 1 MG/ML IJ SOLN
INTRAOCULAR | Status: DC | PRN
  Administered 2019-02-12: 72 mL via OPHTHALMIC

## 2019-02-12 MED ORDER — SODIUM HYALURONATE 10 MG/ML IO SOLN
INTRAOCULAR | Status: DC | PRN
  Administered 2019-02-12: 0.55 mL via INTRAOCULAR

## 2019-02-12 MED ORDER — MIDAZOLAM HCL 2 MG/2ML IJ SOLN
INTRAMUSCULAR | Status: DC | PRN
  Administered 2019-02-12: 2 mg via INTRAVENOUS

## 2019-02-12 MED ORDER — TETRACAINE HCL 0.5 % OP SOLN
1.0000 [drp] | OPHTHALMIC | Status: DC | PRN
  Administered 2019-02-12 (×3): 1 [drp] via OPHTHALMIC

## 2019-02-12 MED ORDER — MOXIFLOXACIN HCL 0.5 % OP SOLN
OPHTHALMIC | Status: DC | PRN
  Administered 2019-02-12: 0.2 mL via OPHTHALMIC

## 2019-02-12 MED ORDER — ARMC OPHTHALMIC DILATING DROPS
1.0000 "application " | OPHTHALMIC | Status: DC | PRN
  Administered 2019-02-12 (×3): 1 via OPHTHALMIC

## 2019-02-12 SURGICAL SUPPLY — 19 items
CANNULA ANT/CHMB 27G (MISCELLANEOUS) ×2 IMPLANT
CANNULA ANT/CHMB 27GA (MISCELLANEOUS) ×6 IMPLANT
DISSECTOR HYDRO NUCLEUS 50X22 (MISCELLANEOUS) ×3 IMPLANT
GLOVE SURG LX 7.5 STRW (GLOVE) ×2
GLOVE SURG LX STRL 7.5 STRW (GLOVE) ×1 IMPLANT
GLOVE SURG SYN 8.5  E (GLOVE) ×2
GLOVE SURG SYN 8.5 E (GLOVE) ×1 IMPLANT
GLOVE SURG SYN 8.5 PF PI (GLOVE) ×1 IMPLANT
GOWN STRL REUS W/ TWL LRG LVL3 (GOWN DISPOSABLE) ×2 IMPLANT
GOWN STRL REUS W/TWL LRG LVL3 (GOWN DISPOSABLE) ×4
LENS IOL TECNIS ITEC 17.5 (Intraocular Lens) ×2 IMPLANT
MARKER SKIN DUAL TIP RULER LAB (MISCELLANEOUS) ×3 IMPLANT
PACK DR. KING ARMS (PACKS) ×3 IMPLANT
PACK EYE AFTER SURG (MISCELLANEOUS) ×3 IMPLANT
PACK OPTHALMIC (MISCELLANEOUS) ×3 IMPLANT
SYR 3ML LL SCALE MARK (SYRINGE) ×3 IMPLANT
SYR TB 1ML LUER SLIP (SYRINGE) ×3 IMPLANT
WATER STERILE IRR 250ML POUR (IV SOLUTION) ×3 IMPLANT
WIPE NON LINTING 3.25X3.25 (MISCELLANEOUS) ×3 IMPLANT

## 2019-02-12 NOTE — Anesthesia Postprocedure Evaluation (Signed)
Anesthesia Post Note  Patient: Jonathon Evans.  Procedure(s) Performed: CATARACT EXTRACTION PHACO AND INTRAOCULAR LENS PLACEMENT (IOC) RIGHT 00:44.3            13.1%          5.86 (Right Eye)     Patient location during evaluation: PACU Anesthesia Type: MAC Level of consciousness: awake and alert Pain management: pain level controlled Vital Signs Assessment: post-procedure vital signs reviewed and stable Respiratory status: spontaneous breathing Cardiovascular status: blood pressure returned to baseline Postop Assessment: no apparent nausea or vomiting, adequate PO intake and no headache Anesthetic complications: no    Adele Barthel Alfonsa Vaile

## 2019-02-12 NOTE — Transfer of Care (Signed)
Immediate Anesthesia Transfer of Care Note  Patient: Jonathon Evans.  Procedure(s) Performed: CATARACT EXTRACTION PHACO AND INTRAOCULAR LENS PLACEMENT (IOC) RIGHT 00:44.3            13.1%          5.86 (Right Eye)  Patient Location: PACU  Anesthesia Type: MAC  Level of Consciousness: awake, alert  and patient cooperative  Airway and Oxygen Therapy: Patient Spontanous Breathing and Patient connected to supplemental oxygen  Post-op Assessment: Post-op Vital signs reviewed, Patient's Cardiovascular Status Stable, Respiratory Function Stable, Patent Airway and No signs of Nausea or vomiting  Post-op Vital Signs: Reviewed and stable  Complications: No apparent anesthesia complications

## 2019-02-12 NOTE — Anesthesia Preprocedure Evaluation (Signed)
Anesthesia Evaluation  Patient identified by MRN, date of birth, ID band Patient awake    History of Anesthesia Complications Negative for: history of anesthetic complications  Airway Mallampati: III  TM Distance: >3 FB Neck ROM: Full    Dental no notable dental hx.    Pulmonary sleep apnea and Continuous Positive Airway Pressure Ventilation ,    Pulmonary exam normal        Cardiovascular Exercise Tolerance: Good negative cardio ROS Normal cardiovascular exam     Neuro/Psych negative neurological ROS  negative psych ROS   GI/Hepatic Neg liver ROS, GERD  ,  Endo/Other  Morbid obesity  Renal/GU negative Renal ROS     Musculoskeletal   Abdominal   Peds  Hematology negative hematology ROS (+)   Anesthesia Other Findings   Reproductive/Obstetrics                             Anesthesia Physical Anesthesia Plan  ASA: III  Anesthesia Plan: MAC   Post-op Pain Management:    Induction: Intravenous  PONV Risk Score and Plan: 1 and Midazolam  Airway Management Planned: Nasal Cannula and Natural Airway  Additional Equipment: None  Intra-op Plan:   Post-operative Plan:   Informed Consent: I have reviewed the patients History and Physical, chart, labs and discussed the procedure including the risks, benefits and alternatives for the proposed anesthesia with the patient or authorized representative who has indicated his/her understanding and acceptance.       Plan Discussed with:   Anesthesia Plan Comments:         Anesthesia Quick Evaluation

## 2019-02-12 NOTE — Op Note (Signed)
OPERATIVE NOTE  Jonathon Evans [id number] 02/12/2019   PREOPERATIVE DIAGNOSIS:  Nuclear sclerotic cataract right eye.  H25.11   POSTOPERATIVE DIAGNOSIS:    Nuclear sclerotic cataract right eye.     PROCEDURE:  Phacoemusification with posterior chamber intraocular lens placement of the right eye   LENS:   Implant Name Type Inv. Item Serial No. Manufacturer Lot No. LRB No. Used Action  LENS IOL DIOP 17.5 - [id number] Intraocular Lens LENS IOL DIOP 17.5 [id number] AMO  Right 1 Implanted       Procedure(s) with comments: CATARACT EXTRACTION PHACO AND INTRAOCULAR LENS PLACEMENT (IOC) RIGHT 00:44.3            13.1%          5.86 (Right) - sleep apnea  PCB00 +17.5   ULTRASOUND TIME: 0 minutes 44 seconds.  CDE 5.86   SURGEON:  Benay Pillow, MD, MPH  ANESTHESIOLOGIST: Anesthesiologist: Page, Adele Barthel, MD CRNA: Jeannene Patella, CRNA   ANESTHESIA:  Topical with tetracaine drops augmented with 1% preservative-free intracameral lidocaine.  ESTIMATED BLOOD LOSS: less than 1 mL.   COMPLICATIONS:  None.   DESCRIPTION OF PROCEDURE:  The patient was identified in the holding room and transported to the operating room and placed in the supine position under the operating microscope.  The right eye was identified as the operative eye and it was prepped and draped in the usual sterile ophthalmic fashion.   A 1.0 millimeter clear-corneal paracentesis was made at the 10:30 position. 0.5 ml of preservative-free 1% lidocaine with epinephrine was injected into the anterior chamber.  The anterior chamber was filled with Healon 5 viscoelastic.  A 2.4 millimeter keratome was used to make a near-clear corneal incision at the 8:00 position.  A curvilinear capsulorrhexis was made with a cystotome and capsulorrhexis forceps.  Balanced salt solution was used to hydrodissect and hydrodelineate the nucleus.   Phacoemulsification was then used in stop and chop fashion to remove the lens nucleus and  epinucleus.  The remaining cortex was then removed using the irrigation and aspiration handpiece. Healon was then placed into the capsular bag to distend it for lens placement.  A lens was then injected into the capsular bag.  The remaining viscoelastic was aspirated.   Wounds were hydrated with balanced salt solution.  The anterior chamber was inflated to a physiologic pressure with balanced salt solution.   Intracameral vigamox 0.1 mL undiluted was injected into the eye and a drop placed onto the ocular surface.  No wound leaks were noted.  The patient was taken to the recovery room in stable condition without complications of anesthesia or surgery  Benay Pillow 02/12/2019, 10:24 AM

## 2019-02-12 NOTE — H&P (Signed)

## 2019-02-13 ENCOUNTER — Encounter: Payer: Self-pay | Admitting: Ophthalmology

## 2019-02-28 ENCOUNTER — Other Ambulatory Visit: Payer: Self-pay

## 2019-02-28 DIAGNOSIS — G8929 Other chronic pain: Secondary | ICD-10-CM

## 2019-02-28 DIAGNOSIS — K219 Gastro-esophageal reflux disease without esophagitis: Secondary | ICD-10-CM

## 2019-02-28 DIAGNOSIS — M25511 Pain in right shoulder: Secondary | ICD-10-CM

## 2019-02-28 MED ORDER — MELOXICAM 15 MG PO TABS: 15.0000 mg | ORAL_TABLET | Freq: Every day | ORAL | 0 refills | Status: DC

## 2019-02-28 NOTE — Telephone Encounter (Signed)
Labs performed 08/29/2017 per paper chart review [facility] renal/liver function and CBC WNL  Last appt with Dr Cheryll Cockayne  08/31/2017.  Patient due for annual labs/physical.  Please contact patient to schedule labs and office visit.  Delayed due to covid pandemic annual visit.  Electronic Rx for mobic 15mg  po daily #30 RF0 sent to his pharmacy of choice.  Shoulder arthritis Rx started by [facility] 2017 had injection also.

## 2019-03-07 ENCOUNTER — Telehealth: Payer: Self-pay

## 2019-03-07 NOTE — Telephone Encounter (Signed)
Wellons, Marinell Blight, RN        Left voice mail no return call   Previous Messages  ----- Message -----  From: Aliene Altes, RN  Sent: 02/28/2019  4:33 PM EST  To: Madelin Headings  Subject: Leal, NP-C renewed Rx refill request.  Requested he come in for labs & physical - last was done with Dr. Corinda Gubler.   AMD

## 2019-03-13 DIAGNOSIS — K219 Gastro-esophageal reflux disease without esophagitis: Secondary | ICD-10-CM | POA: Diagnosis not present

## 2019-03-13 DIAGNOSIS — Z23 Encounter for immunization: Secondary | ICD-10-CM | POA: Diagnosis not present

## 2019-03-13 DIAGNOSIS — Z0000 Encounter for general adult medical examination without abnormal findings: Secondary | ICD-10-CM | POA: Diagnosis not present

## 2019-03-13 DIAGNOSIS — M1A00X0 Idiopathic chronic gout, unspecified site, without tophus (tophi): Secondary | ICD-10-CM | POA: Diagnosis not present

## 2019-03-13 DIAGNOSIS — E785 Hyperlipidemia, unspecified: Secondary | ICD-10-CM | POA: Diagnosis not present

## 2019-03-13 DIAGNOSIS — R972 Elevated prostate specific antigen [PSA]: Secondary | ICD-10-CM | POA: Diagnosis not present

## 2019-03-14 DIAGNOSIS — Z1159 Encounter for screening for other viral diseases: Secondary | ICD-10-CM | POA: Diagnosis not present

## 2019-03-14 DIAGNOSIS — K219 Gastro-esophageal reflux disease without esophagitis: Secondary | ICD-10-CM | POA: Diagnosis not present

## 2019-03-14 DIAGNOSIS — E785 Hyperlipidemia, unspecified: Secondary | ICD-10-CM | POA: Diagnosis not present

## 2019-03-14 DIAGNOSIS — Z125 Encounter for screening for malignant neoplasm of prostate: Secondary | ICD-10-CM | POA: Diagnosis not present

## 2019-11-07 ENCOUNTER — Ambulatory Visit: Payer: Self-pay

## 2019-11-07 ENCOUNTER — Other Ambulatory Visit: Payer: Self-pay

## 2019-11-07 DIAGNOSIS — Z0110 Encounter for examination of ears and hearing without abnormal findings: Secondary | ICD-10-CM

## 2019-11-07 NOTE — Progress Notes (Signed)
Completed annual physical with outside physician.  Presents today for annual hearing screen.  AMD

## 2019-11-20 NOTE — Addendum Note (Signed)
Addended by: Christianne Dolin F on: 11/20/2019 09:50 AM   Modules accepted: Orders

## 2019-11-22 ENCOUNTER — Other Ambulatory Visit: Payer: Self-pay | Admitting: Internal Medicine

## 2019-11-22 DIAGNOSIS — M109 Gout, unspecified: Secondary | ICD-10-CM

## 2020-06-27 ENCOUNTER — Ambulatory Visit: Payer: Self-pay

## 2020-06-27 ENCOUNTER — Other Ambulatory Visit: Payer: Self-pay

## 2020-06-27 DIAGNOSIS — Z0110 Encounter for examination of ears and hearing without abnormal findings: Secondary | ICD-10-CM

## 2020-06-27 NOTE — Progress Notes (Signed)
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 Presents today for annual hearing screen.  AMD

## 2021-01-16 DIAGNOSIS — G4733 Obstructive sleep apnea (adult) (pediatric): Secondary | ICD-10-CM | POA: Diagnosis not present

## 2021-02-16 DIAGNOSIS — G4733 Obstructive sleep apnea (adult) (pediatric): Secondary | ICD-10-CM | POA: Diagnosis not present

## 2021-03-18 DIAGNOSIS — G4733 Obstructive sleep apnea (adult) (pediatric): Secondary | ICD-10-CM | POA: Diagnosis not present

## 2021-03-23 DIAGNOSIS — I10 Essential (primary) hypertension: Secondary | ICD-10-CM | POA: Diagnosis not present

## 2021-03-23 DIAGNOSIS — R739 Hyperglycemia, unspecified: Secondary | ICD-10-CM | POA: Diagnosis not present

## 2021-03-23 DIAGNOSIS — R7303 Prediabetes: Secondary | ICD-10-CM | POA: Diagnosis not present

## 2021-03-23 DIAGNOSIS — G4733 Obstructive sleep apnea (adult) (pediatric): Secondary | ICD-10-CM | POA: Diagnosis not present

## 2021-03-23 DIAGNOSIS — R972 Elevated prostate specific antigen [PSA]: Secondary | ICD-10-CM | POA: Diagnosis not present

## 2021-03-23 DIAGNOSIS — Z0000 Encounter for general adult medical examination without abnormal findings: Secondary | ICD-10-CM | POA: Diagnosis not present

## 2021-03-23 DIAGNOSIS — E785 Hyperlipidemia, unspecified: Secondary | ICD-10-CM | POA: Diagnosis not present

## 2021-04-30 DIAGNOSIS — G4733 Obstructive sleep apnea (adult) (pediatric): Secondary | ICD-10-CM | POA: Diagnosis not present

## 2021-05-31 DIAGNOSIS — G4733 Obstructive sleep apnea (adult) (pediatric): Secondary | ICD-10-CM | POA: Diagnosis not present

## 2021-06-28 DIAGNOSIS — G4733 Obstructive sleep apnea (adult) (pediatric): Secondary | ICD-10-CM | POA: Diagnosis not present

## 2021-07-24 ENCOUNTER — Ambulatory Visit: Payer: Self-pay

## 2021-07-24 DIAGNOSIS — Z0110 Encounter for examination of ears and hearing without abnormal findings: Secondary | ICD-10-CM

## 2021-07-24 NOTE — Progress Notes (Signed)
Presents to [facility] for annual hearing screen.  Works for [employer] Dept & the department is part of the [facility] Hospital doctor. ? ?AMD ?

## 2021-09-21 DIAGNOSIS — I10 Essential (primary) hypertension: Secondary | ICD-10-CM | POA: Diagnosis not present

## 2021-09-21 DIAGNOSIS — E785 Hyperlipidemia, unspecified: Secondary | ICD-10-CM | POA: Diagnosis not present

## 2021-09-21 DIAGNOSIS — G4733 Obstructive sleep apnea (adult) (pediatric): Secondary | ICD-10-CM | POA: Diagnosis not present

## 2021-09-21 DIAGNOSIS — Z6841 Body Mass Index (BMI) 40.0 and over, adult: Secondary | ICD-10-CM | POA: Diagnosis not present

## 2021-09-21 DIAGNOSIS — R7303 Prediabetes: Secondary | ICD-10-CM | POA: Diagnosis not present

## 2021-09-21 DIAGNOSIS — Z713 Dietary counseling and surveillance: Secondary | ICD-10-CM | POA: Diagnosis not present

## 2021-10-29 DIAGNOSIS — G4733 Obstructive sleep apnea (adult) (pediatric): Secondary | ICD-10-CM | POA: Diagnosis not present

## 2022-03-18 DIAGNOSIS — G4733 Obstructive sleep apnea (adult) (pediatric): Secondary | ICD-10-CM | POA: Diagnosis not present

## 2022-03-24 DIAGNOSIS — Z136 Encounter for screening for cardiovascular disorders: Secondary | ICD-10-CM | POA: Diagnosis not present

## 2022-03-24 DIAGNOSIS — R972 Elevated prostate specific antigen [PSA]: Secondary | ICD-10-CM | POA: Diagnosis not present

## 2022-03-24 DIAGNOSIS — Z6841 Body Mass Index (BMI) 40.0 and over, adult: Secondary | ICD-10-CM | POA: Diagnosis not present

## 2022-03-24 DIAGNOSIS — Z0000 Encounter for general adult medical examination without abnormal findings: Secondary | ICD-10-CM | POA: Diagnosis not present

## 2022-03-24 DIAGNOSIS — R7303 Prediabetes: Secondary | ICD-10-CM | POA: Diagnosis not present

## 2022-03-24 DIAGNOSIS — Z8249 Family history of ischemic heart disease and other diseases of the circulatory system: Secondary | ICD-10-CM | POA: Diagnosis not present

## 2022-03-24 DIAGNOSIS — Z713 Dietary counseling and surveillance: Secondary | ICD-10-CM | POA: Diagnosis not present

## 2022-03-24 DIAGNOSIS — G4733 Obstructive sleep apnea (adult) (pediatric): Secondary | ICD-10-CM | POA: Diagnosis not present

## 2022-03-24 DIAGNOSIS — E785 Hyperlipidemia, unspecified: Secondary | ICD-10-CM | POA: Diagnosis not present

## 2022-03-24 DIAGNOSIS — I10 Essential (primary) hypertension: Secondary | ICD-10-CM | POA: Diagnosis not present

## 2022-03-31 ENCOUNTER — Other Ambulatory Visit: Payer: Self-pay | Admitting: Infectious Diseases

## 2022-03-31 DIAGNOSIS — Z8249 Family history of ischemic heart disease and other diseases of the circulatory system: Secondary | ICD-10-CM

## 2022-03-31 DIAGNOSIS — Z136 Encounter for screening for cardiovascular disorders: Secondary | ICD-10-CM

## 2022-05-04 DIAGNOSIS — H40003 Preglaucoma, unspecified, bilateral: Secondary | ICD-10-CM | POA: Diagnosis not present

## 2022-06-09 DIAGNOSIS — H2512 Age-related nuclear cataract, left eye: Secondary | ICD-10-CM | POA: Diagnosis not present

## 2022-07-07 ENCOUNTER — Encounter: Payer: Self-pay | Admitting: Ophthalmology

## 2022-07-15 NOTE — Discharge Instructions (Signed)

## 2022-07-19 ENCOUNTER — Encounter
Admission: RE | Disposition: A | Discharge status: Home / Self Care | Payer: Self-pay | Source: Ambulatory Visit | Attending: Ophthalmology

## 2022-07-19 ENCOUNTER — Encounter: Payer: Self-pay | Admitting: Ophthalmology

## 2022-07-19 ENCOUNTER — Other Ambulatory Visit: Payer: Self-pay

## 2022-07-19 ENCOUNTER — Ambulatory Visit
Admission: RE | Admit: 2022-07-19 | Discharge: 2022-07-19 | Disposition: A | Discharge status: Home / Self Care | Payer: 59 | Source: Ambulatory Visit | Attending: Ophthalmology | Admitting: Ophthalmology

## 2022-07-19 ENCOUNTER — Ambulatory Visit: Payer: 59 | Admitting: Anesthesiology

## 2022-07-19 DIAGNOSIS — R7303 Prediabetes: Secondary | ICD-10-CM | POA: Diagnosis not present

## 2022-07-19 DIAGNOSIS — I4891 Unspecified atrial fibrillation: Secondary | ICD-10-CM | POA: Diagnosis not present

## 2022-07-19 DIAGNOSIS — Z9989 Dependence on other enabling machines and devices: Secondary | ICD-10-CM | POA: Diagnosis not present

## 2022-07-19 DIAGNOSIS — K219 Gastro-esophageal reflux disease without esophagitis: Secondary | ICD-10-CM | POA: Insufficient documentation

## 2022-07-19 DIAGNOSIS — H2512 Age-related nuclear cataract, left eye: Secondary | ICD-10-CM | POA: Diagnosis not present

## 2022-07-19 DIAGNOSIS — Z6841 Body Mass Index (BMI) 40.0 and over, adult: Secondary | ICD-10-CM | POA: Insufficient documentation

## 2022-07-19 DIAGNOSIS — G4730 Sleep apnea, unspecified: Secondary | ICD-10-CM | POA: Insufficient documentation

## 2022-07-19 HISTORY — PX: CATARACT EXTRACTION W/PHACO: SHX586

## 2022-07-19 HISTORY — DX: Prediabetes: R73.03

## 2022-07-19 SURGERY — PHACOEMULSIFICATION, CATARACT, WITH IOL INSERTION
Anesthesia: Monitor Anesthesia Care | Site: Eye | Laterality: Left

## 2022-07-19 MED ORDER — MOXIFLOXACIN HCL 0.5 % OP SOLN
OPHTHALMIC | Status: DC | PRN
  Administered 2022-07-19: .2 mL via OPHTHALMIC

## 2022-07-19 MED ORDER — LACTATED RINGERS IV SOLN: INTRAVENOUS | Status: DC

## 2022-07-19 MED ORDER — TETRACAINE HCL 0.5 % OP SOLN
1.0000 [drp] | OPHTHALMIC | Status: DC | PRN
  Administered 2022-07-19 (×3): 1 [drp] via OPHTHALMIC

## 2022-07-19 MED ORDER — SIGHTPATH DOSE#1 BSS IO SOLN
INTRAOCULAR | Status: DC | PRN
  Administered 2022-07-19: 15 mL

## 2022-07-19 MED ORDER — LIDOCAINE HCL (PF) 2 % IJ SOLN
INTRAOCULAR | Status: DC | PRN
  Administered 2022-07-19: 1 mL via INTRAOCULAR

## 2022-07-19 MED ORDER — FENTANYL CITRATE (PF) 100 MCG/2ML IJ SOLN
INTRAMUSCULAR | Status: DC | PRN
  Administered 2022-07-19: 25 ug via INTRAVENOUS

## 2022-07-19 MED ORDER — ARMC OPHTHALMIC DILATING DROPS
1.0000 | OPHTHALMIC | Status: DC | PRN
  Administered 2022-07-19 (×3): 1 via OPHTHALMIC

## 2022-07-19 MED ORDER — SIGHTPATH DOSE#1 BSS IO SOLN
INTRAOCULAR | Status: DC | PRN
  Administered 2022-07-19: 66 mL via OPHTHALMIC

## 2022-07-19 MED ORDER — MIDAZOLAM HCL 2 MG/2ML IJ SOLN
INTRAMUSCULAR | Status: DC | PRN
  Administered 2022-07-19: 2 mg via INTRAVENOUS

## 2022-07-19 MED ORDER — SIGHTPATH DOSE#1 NA HYALUR & NA CHOND-NA HYALUR IO KIT
PACK | INTRAOCULAR | Status: DC | PRN
  Administered 2022-07-19: 1 via OPHTHALMIC

## 2022-07-19 SURGICAL SUPPLY — 13 items
CATARACT SUITE SIGHTPATH (MISCELLANEOUS) ×1 IMPLANT
DISSECTOR HYDRO NUCLEUS 50X22 (MISCELLANEOUS) ×1 IMPLANT
FEE CATARACT SUITE SIGHTPATH (MISCELLANEOUS) ×1 IMPLANT
GLOVE SURG GAMMEX PI TX LF 7.5 (GLOVE) ×1 IMPLANT
GLOVE SURG SYN 8.5  E (GLOVE) ×1
GLOVE SURG SYN 8.5 E (GLOVE) ×1 IMPLANT
GLOVE SURG SYN 8.5 PF PI (GLOVE) ×1 IMPLANT
LENS IOL TECNIS EYHANCE 16.5 (Intraocular Lens) IMPLANT
NDL FILTER BLUNT 18X1 1/2 (NEEDLE) ×1 IMPLANT
NEEDLE FILTER BLUNT 18X1 1/2 (NEEDLE) ×1 IMPLANT
SYR 3ML LL SCALE MARK (SYRINGE) ×1 IMPLANT
SYR 5ML LL (SYRINGE) ×1 IMPLANT
WATER STERILE IRR 250ML POUR (IV SOLUTION) ×1 IMPLANT

## 2022-07-19 NOTE — Op Note (Signed)
OPERATIVE NOTE  Abrahm Blocker [id number] 07/19/2022   PREOPERATIVE DIAGNOSIS:  Nuclear sclerotic cataract left eye.  H25.12   POSTOPERATIVE DIAGNOSIS:    Nuclear sclerotic cataract left eye.     PROCEDURE:  Phacoemusification with posterior chamber intraocular lens placement of the left eye   LENS:   Implant Name Type Inv. Item Serial No. Manufacturer Lot No. LRB No. Used Action  LENS IOL TECNIS EYHANCE 16.5 - [id number] Intraocular Lens LENS IOL TECNIS EYHANCE 16.5 [id number] SIGHTPATH  Left 1 Implanted      Procedure(s): CATARACT EXTRACTION PHACO AND INTRAOCULAR LENS PLACEMENT (IOC) LEFT  2.50  00:20.0 (Left)  DIB00 +16.5   ULTRASOUND TIME: 0 minutes 20 seconds.  CDE 2.50   SURGEON:  Willey Blade, MD, MPH   ANESTHESIA:  Topical with tetracaine drops augmented with 1% preservative-free intracameral lidocaine.  ESTIMATED BLOOD LOSS: <1 mL   COMPLICATIONS:  None.   DESCRIPTION OF PROCEDURE:  The patient was identified in the holding room and transported to the operating room and placed in the supine position under the operating microscope.  The left eye was identified as the operative eye and it was prepped and draped in the usual sterile ophthalmic fashion.   A 1.0 millimeter clear-corneal paracentesis was made at the 5:00 position. 0.5 ml of preservative-free 1% lidocaine with epinephrine was injected into the anterior chamber.  The anterior chamber was filled with viscoelastic.  A 2.4 millimeter keratome was used to make a near-clear corneal incision at the 2:00 position.  A curvilinear capsulorrhexis was made with a cystotome and capsulorrhexis forceps.  Balanced salt solution was used to hydrodissect and hydrodelineate the nucleus.   Phacoemulsification was then used in stop and chop fashion to remove the lens nucleus and epinucleus.  The remaining cortex was then removed using the irrigation and aspiration handpiece. Viscoelastic was then placed into the capsular bag to  distend it for lens placement.  A lens was then injected into the capsular bag.  The remaining viscoelastic was aspirated.   Wounds were hydrated with balanced salt solution.  The anterior chamber was inflated to a physiologic pressure with balanced salt solution.  Intracameral vigamox 0.1 mL undiltued was injected into the eye and a drop placed onto the ocular surface.  No wound leaks were noted.  The patient was taken to the recovery room in stable condition without complications of anesthesia or surgery  Willey Blade 07/19/2022, 8:40 AM

## 2022-07-19 NOTE — Anesthesia Preprocedure Evaluation (Signed)
Anesthesia Evaluation  Patient identified by MRN, date of birth, ID band Patient awake    Reviewed: Allergy & Precautions, H&P , NPO status , Patient's Chart, lab work & pertinent test results  Airway Mallampati: IV  TM Distance: >3 FB   Mouth opening: Limited Mouth Opening  Dental no notable dental hx.    Pulmonary neg pulmonary ROS, sleep apnea and Continuous Positive Airway Pressure Ventilation    breath sounds clear to auscultation       Cardiovascular negative cardio ROS  Rhythm:Regular Rate:Normal     Neuro/Psych negative neurological ROS  negative psych ROS   GI/Hepatic Neg liver ROS,GERD  Controlled and Medicated,,  Endo/Other    Morbid obesity"Pre-diabetes" on semaglutide  Renal/GU negative Renal ROS  negative genitourinary   Musculoskeletal negative musculoskeletal ROS (+) Arthritis , Osteoarthritis,    Abdominal  (+) + obese  Peds negative pediatric ROS (+)  Hematology negative hematology ROS (+)   Anesthesia Other Findings GERD (gastroesophageal reflux disease) Sleep apnea CPAP uses nightly Arthritis  Pre-diabetes    Reproductive/Obstetrics negative OB ROS                             Anesthesia Physical Anesthesia Plan  ASA: 3  Anesthesia Plan: MAC   Post-op Pain Management:    Induction: Intravenous  PONV Risk Score and Plan:   Airway Management Planned: Natural Airway and Nasal Cannula  Additional Equipment:   Intra-op Plan:   Post-operative Plan:   Informed Consent: I have reviewed the patients History and Physical, chart, labs and discussed the procedure including the risks, benefits and alternatives for the proposed anesthesia with the patient or authorized representative who has indicated his/her understanding and acceptance.     Dental Advisory Given  Plan Discussed with: Anesthesiologist, CRNA and Surgeon  Anesthesia Plan Comments: (Patient  consented for risks of anesthesia including but not limited to:  - adverse reactions to medications - damage to eyes, teeth, lips or other oral mucosa - nerve damage due to positioning  - sore throat or hoarseness - Damage to heart, brain, nerves, lungs, other parts of body or loss of life  Patient voiced understanding.)       Anesthesia Quick Evaluation

## 2022-07-19 NOTE — Transfer of Care (Signed)
Immediate Anesthesia Transfer of Care Note  Patient: Jonathon Evans.  Procedure(s) Performed: CATARACT EXTRACTION PHACO AND INTRAOCULAR LENS PLACEMENT (IOC) LEFT  2.50  00:20.0 (Left: Eye)  Patient Location: PACU  Anesthesia Type: MAC  Level of Consciousness: awake, alert  and patient cooperative  Airway and Oxygen Therapy: Patient Spontanous Breathing and Patient connected to supplemental oxygen  Post-op Assessment: Post-op Vital signs reviewed, Patient's Cardiovascular Status Stable, Respiratory Function Stable, Patent Airway and No signs of Nausea or vomiting  Post-op Vital Signs: Reviewed and stable  Complications: No notable events documented.

## 2022-07-19 NOTE — Anesthesia Postprocedure Evaluation (Signed)
Anesthesia Post Note  Patient: Jonathon Evans.  Procedure(s) Performed: CATARACT EXTRACTION PHACO AND INTRAOCULAR LENS PLACEMENT (IOC) LEFT  2.50  00:20.0 (Left: Eye)  Patient location during evaluation: PACU Anesthesia Type: MAC Level of consciousness: awake and alert Pain management: pain level controlled Vital Signs Assessment: post-procedure vital signs reviewed and stable Respiratory status: spontaneous breathing, nonlabored ventilation, respiratory function stable and patient connected to nasal cannula oxygen Cardiovascular status: stable and blood pressure returned to baseline Postop Assessment: no apparent nausea or vomiting Anesthetic complications: no Comments: Intraop, patient in sinus rhythm, monitored by CRNA, but in PACU, patient found to be in atrial fibrillation w/slow ventricular response confirmed by EKG. Patient is stable. Denies chest pain, pressure, tightness in chest, denies dyspnea, skin is warm and dry, vital signs stable. Wife a PACU RN at [facility], and she noted the atrial fibrillation on rhythm strip postop.  As mentioned, atrial fibrillation w/slow ventricular response is confirmed by EKG.  Patient is stable, and able to be transported per car to followup care by his physician. [phone], Both the wife and I spoke on phone with "Zane Herald" for Dr. Dierdre Harness, and confirmed patient to go directly to Dr. Jarrett Ables office, arrive by 10am and will be seen at 1015am today. BP was 133/74, heart rate in the low 60s. AAOx3, eupnea.  I gave wife a copy of the EKG showing atrial fib with slow ventricular response, and she will take that to office.  Heart rate auscultated as irregularly irregular.  Patient discharged per wheelchair, to car, wife to take patient directly to Dr. Jarrett Ables office. Jocelyn Lamer MD [phone]   No notable events documented.   Last Vitals:  Vitals:   07/19/22 0850 07/19/22 0900  BP: 124/87 113/87  Pulse: 65 63  Resp: 19 20  Temp: (!)  36.3 C   SpO2: 95% 96%    Last Pain:  Vitals:   07/19/22 0900  TempSrc:   PainSc: 0-No pain                 Rolando Hessling C Fransico Sciandra

## 2022-07-19 NOTE — H&P (Signed)
[facility]   Primary Care Physician:  Mick Sell, MD Ophthalmologist: Dr. Willey Blade  Pre-Procedure History & Physical: HPI:  Jonathon Evans. is a 62 y.o. male here for cataract surgery.   Past Medical History:  Diagnosis Date   Arthritis    Right shoulder   GERD (gastroesophageal reflux disease)    Pre-diabetes    Sleep apnea    CPAP    Past Surgical History:  Procedure Laterality Date   CATARACT EXTRACTION W/PHACO Right 02/12/2019   Procedure: CATARACT EXTRACTION PHACO AND INTRAOCULAR LENS PLACEMENT (IOC) RIGHT 00:44.3            13.1%          5.86;  Surgeon: Nevada Crane, MD;  Location: [facility];  Service: Ophthalmology;  Laterality: Right;  sleep apnea   colonoscopy and upper endoscopy     COLONOSCOPY WITH PROPOFOL N/A 02/16/2016   Procedure: COLONOSCOPY WITH PROPOFOL;  Surgeon: Christena Deem, MD;  Location: [facility] END[facility]COPY;  Service: Endoscopy;  Laterality: N/A;    Prior to Admission medications   Medication Sig Start Date End Date Taking? Authorizing Provider  aspirin EC 81 MG tablet Take 81 mg by mouth daily.   Yes [provider]  GLUCOSAMINE-FISH OIL-EPA-DHA PO Take by mouth.   Yes [provider]  Multiple Vitamin (MULTIVITAMIN) tablet Take 1 tablet by mouth daily.   Yes [provider]  omeprazole (PRILOSEC) 20 MG capsule Take 1 capsule (20 mg total) by mouth daily. 12/07/18  Yes Rae Halsted, PA-C  rosuvastatin (CRESTOR) 10 MG tablet Take 10 mg by mouth daily.   Yes [provider]  Semaglutide, 2 MG/DOSE, (OZEMPIC, 2 MG/DOSE,) 8 MG/3ML SOPN Inject 2 mg into the skin once a week. Thursdays   Yes [provider]  sildenafil (VIAGRA) 100 MG tablet Take 100 mg by mouth daily as needed for erectile dysfunction.   Yes [provider]  vitamin B-12 (CYANOCOBALAMIN) 1000 MCG tablet Take 1,000 mcg by mouth daily.   Yes [provider]    Allergies as of 06/02/2022   (No  Known Allergies)    History reviewed. No pertinent family history.  Social History   Socioeconomic History   Marital status: Married    Spouse name: Not on file   Number of children: Not on file   Years of education: Not on file   Highest education level: Not on file  Occupational History   Not on file  Tobacco Use   Smoking status: Never   Smokeless tobacco: Never  Vaping Use   Vaping Use: Never used  Substance and Sexual Activity   Alcohol use: No    Comment: rare - Holidays   Drug use: No   Sexual activity: Not on file  Other Topics Concern   Not on file  Social History Narrative   Not on file   Social Determinants of Health   Financial Resource Strain: Not on file  Food Insecurity: Not on file  Transportation Needs: Not on file  Physical Activity: Not on file  Stress: Not on file  Social Connections: Not on file  Intimate Partner Violence: Not on file    Review of Systems: See HPI, otherwise negative ROS  Physical Exam: BP (!) 145/101   Pulse 80   Temp (!) 97.5 F (36.4 C) (Temporal)   Resp 20   Ht 6\' 1"  (1.854 m)   Wt (!) 155.1 kg   SpO2 97%  BMI 45.12 kg/m  General:   Alert, cooperative in NAD Head:  Normocephalic and atraumatic. Respiratory:  Normal work of breathing. Cardiovascular:  RRR  Impression/Plan: Jonathon Evans. is here for cataract surgery.  Risks, benefits, limitations, and alternatives regarding cataract surgery have been reviewed with the patient.  Questions have been answered.  All parties agreeable.   Willey Blade, MD  07/19/2022, 7:41 AM

## 2022-07-21 ENCOUNTER — Encounter: Payer: Self-pay | Admitting: Ophthalmology

## 2022-07-29 ENCOUNTER — Encounter: Payer: Self-pay | Admitting: Cardiology

## 2022-07-29 ENCOUNTER — Ambulatory Visit: Payer: 59 | Attending: Cardiology | Admitting: Cardiology

## 2022-07-29 VITALS — BP 148/88 | HR 65 | Ht 73.0 in | Wt 345.0 lb

## 2022-07-29 DIAGNOSIS — E782 Mixed hyperlipidemia: Secondary | ICD-10-CM

## 2022-07-29 DIAGNOSIS — R030 Elevated blood-pressure reading, without diagnosis of hypertension: Secondary | ICD-10-CM

## 2022-07-29 DIAGNOSIS — I4819 Other persistent atrial fibrillation: Secondary | ICD-10-CM

## 2022-07-29 NOTE — Progress Notes (Signed)
Cardiology Office Note:    Date:  07/29/2022   ID:  Jonathon Evans., DOB May 20, 1960, MRN [id number]  PCP:  Mick Sell, MD   [facility] Providers Cardiologist:  Debbe Odea, MD     Referring MD: Mick Sell, MD   Chief Complaint  Patient presents with   New Patient (Initial Visit)    New patient evaluation for A-fibrillation.  No symptoms.     Jonathon Xian Alves. is a 62 y.o. male who is being seen today for the evaluation of atrial fibrillation at the request of Mick Sell, MD.  History of Present Illness:    Jonathon Evans. is a 62 y.o. male with a hx of hyperlipidemia, GERD, OSA on CPAP presenting due to new onset atrial fibrillation.  Patient had left eye cataract surgery on 07/19/2022.  Postoperatively, EKG showed atrial fibrillation heart rate 59.  He denies any prior episodes of A-fib.  Denies any history of heart disease.  CHA2DS2-VASc 2 score 0, aspirin 81 mg continued.  He denies palpitations, dizziness, chest pain or shortness of breath.  Denies any history of heart disease, denies any cerebrovascular accident.  Past Medical History:  Diagnosis Date   Arthritis    Right shoulder   GERD (gastroesophageal reflux disease)    Hyperlipidemia    Pre-diabetes    Sleep apnea    CPAP    Past Surgical History:  Procedure Laterality Date   CATARACT EXTRACTION W/PHACO Right 02/12/2019   Procedure: CATARACT EXTRACTION PHACO AND INTRAOCULAR LENS PLACEMENT (IOC) RIGHT 00:44.3            13.1%          5.86;  Surgeon: Nevada Crane, MD;  Location: [facility];  Service: Ophthalmology;  Laterality: Right;  sleep apnea   CATARACT EXTRACTION W/PHACO Left 07/19/2022   Procedure: CATARACT EXTRACTION PHACO AND INTRAOCULAR LENS PLACEMENT (IOC) LEFT  2.50  00:20.0;  Surgeon: Nevada Crane, MD;  Location: [facility];  Service: Ophthalmology;  Laterality: Left;   colonoscopy and upper endoscopy     COLONOSCOPY WITH PROPOFOL  N/A 02/16/2016   Procedure: COLONOSCOPY WITH PROPOFOL;  Surgeon: Christena Deem, MD;  Location: [facility] ENDOSCOPY;  Service: Endoscopy;  Laterality: N/A;    Current Medications: Current Meds  Medication Sig   aspirin EC 81 MG tablet Take 81 mg by mouth daily.   GLUCOSAMINE-FISH OIL-EPA-DHA PO Take by mouth.   Multiple Vitamin (MULTIVITAMIN) tablet Take 1 tablet by mouth daily.   omeprazole (PRILOSEC) 20 MG capsule Take 1 capsule (20 mg total) by mouth daily.   rosuvastatin (CRESTOR) 10 MG tablet Take 10 mg by mouth daily.   Semaglutide, 2 MG/DOSE, (OZEMPIC, 2 MG/DOSE,) 8 MG/3ML SOPN Inject 2 mg into the skin once a week. Thursdays   sildenafil (VIAGRA) 100 MG tablet Take 100 mg by mouth daily as needed for erectile dysfunction.     Allergies:   Patient has no known allergies.   Social History   Socioeconomic History   Marital status: Married    Spouse name: Not on file   Number of children: Not on file   Years of education: Not on file   Highest education level: Not on file  Occupational History   Not on file  Tobacco Use   Smoking status: Never   Smokeless tobacco: Never  Vaping Use   Vaping Use: Never used  Substance and Sexual Activity   Alcohol use: No  Comment: rare - Holidays   Drug use: No   Sexual activity: Not on file  Other Topics Concern   Not on file  Social History Narrative   Not on file   Social Determinants of Health   Financial Resource Strain: Not on file  Food Insecurity: Not on file  Transportation Needs: Not on file  Physical Activity: Not on file  Stress: Not on file  Social Connections: Not on file     Family History: The patient's family history is not on file.  ROS:   Please see the history of present illness.     All other systems reviewed and are negative.  EKGs/Labs/Other Studies Reviewed:    The following studies were reviewed today:   EKG:  EKG is  ordered today.  The ekg ordered today demonstrates atrial fibrillation,  right bundle branch block, heart rate 65  Recent Labs: No results found for requested labs within last 365 days.  Recent Lipid Panel No results found for: "CHOL", "TRIG", "HDL", "CHOLHDL", "VLDL", "LDLCALC", "LDLDIRECT"  Outside lipid panel 08/12/2020 total cholesterol 171, LDL 71, HDL 64, triglycerides 182  Risk Assessment/Calculations:    HYPERTENSION CONTROL Vitals:   07/29/22 1003 07/29/22 1004  BP: (!) 142/88 (!) 148/88    The patient's blood pressure is elevated above target today.  In order to address the patient's elevated BP: Blood pressure will be monitored at home to determine if medication changes need to be made.            Physical Exam:    VS:  BP (!) 148/88 (BP Location: Right Arm, Patient Position: Sitting, Cuff Size: Large)   Pulse 65   Ht  (1.854 m)   Wt (!) 345 lb (156.5 kg)   SpO2 97%   BMI 45.52 kg/m     Wt Readings from Last 3 Encounters:  07/29/22 (!) 345 lb (156.5 kg)  07/19/22 (!) 342 lb (155.1 kg)  02/12/19 (!) 355 lb (161 kg)     GEN:  Well nourished, well developed in no acute distress HEENT: Normal NECK: No JVD; No carotid bruits CARDIAC: Irregular irregular, no murmurs, rubs, gallops RESPIRATORY:  Clear to auscultation without rales, wheezing or rhonchi  ABDOMEN: Soft, non-tender, non-distended MUSCULOSKELETAL:  No edema; No deformity  SKIN: Warm and dry NEUROLOGIC:  Alert and oriented x 3 PSYCHIATRIC:  Normal affect   ASSESSMENT:    1. Persistent atrial fibrillation   2. Morbid obesity   3. Elevated BP without diagnosis of hypertension   4. Mixed hyperlipidemia    PLAN:    In order of problems listed above:  Persistent atrial fibrillation, asymptomatic, CHA2DS2-VASc score of 0/1 (?htn).  Get echocardiogram, continue aspirin 81 mg daily.  Refer to A-fib clinic.  Not sure if patient is candidate for rhythm control strategy due to morbid obesity, sleep apnea. Morbid obesity, low-calorie diet, weight loss advised.  Refer  to dietary services. Elevated BP, advised to monitor BP at home.  If elevated at follow-up visit, plan to start antihypertensive. Hyperlipidemia, continue Crestor.  Follow-up after echo.     Medication Adjustments/Labs and Tests Ordered: Current medicines are reviewed at length with the patient today.  Concerns regarding medicines are outlined above.  Orders Placed This Encounter  Procedures   Ambulatory referral to Cardiac Electrophysiology   Amb ref to Medical Nutrition Therapy-MNT   EKG 12-Lead   ECHOCARDIOGRAM COMPLETE   No orders of the defined types were placed in this encounter.   Patient Instructions  Medication Instructions:   Your physician recommends that you continue on your current medications as directed. Please refer to the Current Medication list given to you today.  *If you need a refill on your cardiac medications before your next appointment, please call your pharmacy*   Lab Work:  None Ordered  If you have labs (blood work) drawn today and your tests are completely normal, you will receive your results only by: MyChart Message (if you have MyChart) OR A paper copy in the mail If you have any lab test that is abnormal or we need to change your treatment, we will call you to review the results.   Testing/Procedures:  Your physician has requested that you have an echocardiogram. Echocardiography is a painless test that uses sound waves to create images of your heart. It provides your doctor with information about the size and shape of your heart and how well your heart's chambers and valves are working. This procedure takes approximately one hour. There are no restrictions for this procedure. Please do NOT wear cologne, perfume, aftershave, or lotions (deodorant is allowed). Please arrive 15 minutes prior to your appointment time.    Follow-Up: At [facility], you and your health needs are our priority.  As part of our continuing mission to  provide you with exceptional heart care, we have created designated Provider Care Teams.  These Care Teams include your primary Cardiologist (physician) and Advanced Practice Providers (APPs -  Physician Assistants and Nurse Practitioners) who all work together to provide you with the care you need, when you need it.  We recommend signing up for the patient portal called "MyChart".  Sign up information is provided on this After Visit Summary.  MyChart is used to connect with patients for Virtual Visits (Telemedicine).  Patients are able to view lab/test results, encounter notes, upcoming appointments, etc.  Non-urgent messages can be sent to your provider as well.   To learn more about what you can do with MyChart, go to [email or web address].    Your next appointment:    After Echocardiogram  Provider:   You may see Debbe Odea, MD or one of the following Advanced Practice Providers on your designated Care Team:   Nicolasa Ducking, NP Eula Listen, PA-C Cadence Fransico Michael, PA-C Charlsie Quest, NP   Signed, Debbe Odea, MD  07/29/2022 11:09 AM    [facility]

## 2022-07-29 NOTE — Patient Instructions (Signed)
Medication Instructions:   Your physician recommends that you continue on your current medications as directed. Please refer to the Current Medication list given to you today.  *If you need a refill on your cardiac medications before your next appointment, please call your pharmacy*   Lab Work:  None Ordered  If you have labs (blood work) drawn today and your tests are completely normal, you will receive your results only by: MyChart Message (if you have MyChart) OR A paper copy in the mail If you have any lab test that is abnormal or we need to change your treatment, we will call you to review the results.   Testing/Procedures:  Your physician has requested that you have an echocardiogram. Echocardiography is a painless test that uses sound waves to create images of your heart. It provides your doctor with information about the size and shape of your heart and how well your heart's chambers and valves are working. This procedure takes approximately one hour. There are no restrictions for this procedure. Please do NOT wear cologne, perfume, aftershave, or lotions (deodorant is allowed). Please arrive 15 minutes prior to your appointment time.    Follow-Up: At [facility], you and your health needs are our priority.  As part of our continuing mission to provide you with exceptional heart care, we have created designated Provider Care Teams.  These Care Teams include your primary Cardiologist (physician) and Advanced Practice Providers (APPs -  Physician Assistants and Nurse Practitioners) who all work together to provide you with the care you need, when you need it.  We recommend signing up for the patient portal called "MyChart".  Sign up information is provided on this After Visit Summary.  MyChart is used to connect with patients for Virtual Visits (Telemedicine).  Patients are able to view lab/test results, encounter notes, upcoming appointments, etc.  Non-urgent messages can  be sent to your provider as well.   To learn more about what you can do with MyChart, go to [email or web address].    Your next appointment:    After Echocardiogram  Provider:   You may see Brian Agbor-Etang, MD or one of the following Advanced Practice Providers on your designated Care Team:   Christopher Berge, NP Ryan Dunn, PA-C Cadence Furth, PA-C Sheri Hammock, NP 

## 2022-07-30 DIAGNOSIS — I4891 Unspecified atrial fibrillation: Secondary | ICD-10-CM | POA: Diagnosis not present

## 2022-08-26 ENCOUNTER — Ambulatory Visit: Payer: 59 | Attending: Cardiology

## 2022-08-26 DIAGNOSIS — I4819 Other persistent atrial fibrillation: Secondary | ICD-10-CM

## 2022-08-26 LAB — ECHOCARDIOGRAM COMPLETE
AR max vel: 3.84 cm2
AV Area VTI: 4.09 cm2
AV Area mean vel: 3.91 cm2
AV Mean grad: 4.3 mmHg
AV Peak grad: 8.6 mmHg
Ao pk vel: 1.47 m/s
S' Lateral: 4.3 cm

## 2022-08-26 MED ORDER — PERFLUTREN LIPID MICROSPHERE
1.0000 mL | INTRAVENOUS | Status: AC | PRN
Start: 2022-08-26 — End: 2022-08-26
  Administered 2022-08-26: 2 mL via INTRAVENOUS

## 2022-08-30 DIAGNOSIS — L738 Other specified follicular disorders: Secondary | ICD-10-CM | POA: Diagnosis not present

## 2022-08-30 DIAGNOSIS — L821 Other seborrheic keratosis: Secondary | ICD-10-CM | POA: Diagnosis not present

## 2022-08-30 DIAGNOSIS — L820 Inflamed seborrheic keratosis: Secondary | ICD-10-CM | POA: Diagnosis not present

## 2022-08-30 DIAGNOSIS — D225 Melanocytic nevi of trunk: Secondary | ICD-10-CM | POA: Diagnosis not present

## 2022-08-30 DIAGNOSIS — L570 Actinic keratosis: Secondary | ICD-10-CM | POA: Diagnosis not present

## 2022-08-30 DIAGNOSIS — L728 Other follicular cysts of the skin and subcutaneous tissue: Secondary | ICD-10-CM | POA: Diagnosis not present

## 2022-08-30 DIAGNOSIS — D485 Neoplasm of uncertain behavior of skin: Secondary | ICD-10-CM | POA: Diagnosis not present

## 2022-09-06 NOTE — Progress Notes (Signed)
  Electrophysiology Office Note:    Date:  09/08/2022   ID:  Jonathon Evans., DOB [birth year], MRN [id number]  [facility] Cardiologist:  Jonathon Odea, MD  [facility] Electrophysiologist:  Jonathon Prude, MD   Referring MD: Jonathon Odea, MD   Chief Complaint: Persistent atrial fibrillation  History of Present Illness:    Jonathon Evans. is a 62 y.o. male who I am seeing today for an evaluation of persistent atrial fibrillation at the request of Dr. Azucena Evans.  The patient was seen by Dr. Azucena Evans July 29, 2022.  The patient has a history of hyperlipidemia, GERD, obstructive sleep apnea on CPAP, atrial fibrillation.  His atrial fibrillation was diagnosed after cataract surgery on July 19, 2022.  At the appoint with Dr. Azucena Evans, he was asymptomatic.  He was on aspirin 81 mg by mouth once daily.  He is with his wife today in clinic.      Their past medical, social and family history was reveiwed.   ROS:   Please see the history of present illness.    All other systems reviewed and are negative.  EKGs/Labs/Other Studies Reviewed:    The following studies were reviewed today:  Aug 26, 2022 echo EF 55-60 RV mildly reduced Moderately dilated left atrium Mild to moderate MR   July 29, 2022 EKG shows atrial fibrillation with a ventricular rate of 65 bpm, right bundle branch block  Today's EKG shows atrial fibrillation with a ventricular rate of 65 bpm.  There is a right bundle branch block.     Physical Exam:    VS:  BP 126/86   Pulse 65   Ht 6\' 1"  (1.854 m)   Wt (!) 342 lb (155.1 kg)   SpO2 98%   BMI 45.12 kg/m     Wt Readings from Last 3 Encounters:  09/08/22 (!) 342 lb (155.1 kg)  07/29/22 (!) 345 lb (156.5 kg)  07/19/22 (!) 342 lb (155.1 kg)     GEN:  Well nourished, well developed in no acute distress.  Obese CARDIAC: Irregularly irregular, no murmurs, rubs, gallops RESPIRATORY:  Clear to auscultation without rales, wheezing  or rhonchi       ASSESSMENT AND PLAN:    1. Persistent atrial fibrillation (HCC)   2. Morbid obesity (HCC)     #Persistent atrial fibrillation Asymptomatic.  Rate controlled.  Continue aspirin 81 mg by mouth once daily for CHA2DS2-VASc of possibly 1 (?hypertension).  We discussed the stroke risk in detail during today's clinic appointment.  We discussed how his risk of stroke with change should his medical comorbidities change with time.  We discussed how age alone will impact a stroke risk moving forward.  He should have follow-up once a year with either cardiologist or electrophysiology clinic.  He is currently not a candidate for invasive EP procedures given his morbid obesity.  #Morbid obesity Weight loss encouraged.  He has referral already in place to the healthy living class. Information about the Mediterranean diet was provided to the patient during today's clinic appointment.  Follow-up 1 year with APP.       Signed, Rossie Muskrat. Lalla Brothers, MD, [facility], [facility] 09/08/2022 1:31 PM    Electrophysiology [facility]

## 2022-09-08 ENCOUNTER — Ambulatory Visit: Payer: 59 | Attending: Cardiology | Admitting: Cardiology

## 2022-09-08 ENCOUNTER — Encounter: Payer: Self-pay | Admitting: Cardiology

## 2022-09-08 VITALS — BP 126/86 | HR 65 | Ht 73.0 in | Wt 342.0 lb

## 2022-09-08 DIAGNOSIS — I4819 Other persistent atrial fibrillation: Secondary | ICD-10-CM

## 2022-09-08 NOTE — Patient Instructions (Signed)
Medication Instructions:  Your physician recommends that you continue on your current medications as directed. Please refer to the Current Medication list given to you today.  *If you need a refill on your cardiac medications before your next appointment, please call your pharmacy*  Follow-Up: At [facility], you and your health needs are our priority.  As part of our continuing mission to provide you with exceptional heart care, we have created designated Provider Care Teams.  These Care Teams include your primary Cardiologist (physician) and Advanced Practice Providers (APPs -  Physician Assistants and Nurse Practitioners) who all work together to provide you with the care you need, when you need it.  Your next appointment:   1 year(s)  Provider:   Suzann Riddle, NP  

## 2022-09-16 ENCOUNTER — Ambulatory Visit: Payer: 59 | Attending: Cardiology | Admitting: Cardiology

## 2022-09-16 ENCOUNTER — Encounter: Payer: Self-pay | Admitting: Cardiology

## 2022-09-16 VITALS — BP 132/84 | HR 62 | Wt 342.0 lb

## 2022-09-16 DIAGNOSIS — R030 Elevated blood-pressure reading, without diagnosis of hypertension: Secondary | ICD-10-CM | POA: Diagnosis not present

## 2022-09-16 DIAGNOSIS — I4819 Other persistent atrial fibrillation: Secondary | ICD-10-CM

## 2022-09-16 DIAGNOSIS — E782 Mixed hyperlipidemia: Secondary | ICD-10-CM

## 2022-09-16 NOTE — Progress Notes (Signed)
Cardiology Office Note:    Date:  09/16/2022   ID:  Jonathon Derrick., DOB 26-Aug-1960, MRN [id number]  PCP:  Mick Sell, MD   [facility] Providers Cardiologist:  Debbe Odea, MD Electrophysiologist:  Lanier Prude, MD     Referring MD: Mick Sell, MD   Chief Complaint  Patient presents with   Follow-up    Discuss 08/26/22 echo results.  Patient denies new or acute cardiac problems/concerns today.      History of Present Illness:    Jonathon Brently Stawski. is a 62 y.o. male with a hx of hyperlipidemia, persistent atrial fibrillation, GERD, OSA on CPAP presenting for follow-up.  Previously seen for persistent A-fib, echo was obtained to evaluate any structural abnormalities.  Denies palpitations, dizziness or shortness of breath.  Evaluated by EP, not deemed a candidate for invasive procedures due to morbid obesity.  Currently takes aspirin 81 mg daily.  Feels well, tolerating Ozempic, initially lost some weight but has plateaued.  Not exercising as much as he should, not maintaining a low calorie diet.   Past Medical History:  Diagnosis Date   Arthritis    Right shoulder   GERD (gastroesophageal reflux disease)    Hyperlipidemia    Pre-diabetes    Sleep apnea    CPAP    Past Surgical History:  Procedure Laterality Date   CATARACT EXTRACTION W/PHACO Right 02/12/2019   Procedure: CATARACT EXTRACTION PHACO AND INTRAOCULAR LENS PLACEMENT (IOC) RIGHT 00:44.3            13.1%          5.86;  Surgeon: Nevada Crane, MD;  Location: [facility];  Service: Ophthalmology;  Laterality: Right;  sleep apnea   CATARACT EXTRACTION W/PHACO Left 07/19/2022   Procedure: CATARACT EXTRACTION PHACO AND INTRAOCULAR LENS PLACEMENT (IOC) LEFT  2.50  00:20.0;  Surgeon: Nevada Crane, MD;  Location: [facility];  Service: Ophthalmology;  Laterality: Left;   colonoscopy and upper endoscopy     COLONOSCOPY WITH PROPOFOL N/A 02/16/2016   Procedure:  COLONOSCOPY WITH PROPOFOL;  Surgeon: Christena Deem, MD;  Location: [facility];  Service: Endoscopy;  Laterality: N/A;    Current Medications: Current Meds  Medication Sig   aspirin EC 81 MG tablet Take 81 mg by mouth daily.   Multiple Vitamin (MULTIVITAMIN) tablet Take 1 tablet by mouth daily.   omeprazole (PRILOSEC) 20 MG capsule Take 1 capsule (20 mg total) by mouth daily.   rosuvastatin (CRESTOR) 10 MG tablet Take 10 mg by mouth daily.   Semaglutide, 2 MG/DOSE, (OZEMPIC, 2 MG/DOSE,) 8 MG/3ML SOPN Inject 2 mg into the skin once a week. Thursdays   sildenafil (VIAGRA) 100 MG tablet Take 100 mg by mouth daily as needed for erectile dysfunction.     Allergies:   Patient has no known allergies.   Social History   Socioeconomic History   Marital status: Married    Spouse name: Not on file   Number of children: Not on file   Years of education: Not on file   Highest education level: Not on file  Occupational History   Not on file  Tobacco Use   Smoking status: Never   Smokeless tobacco: Never  Vaping Use   Vaping Use: Never used  Substance and Sexual Activity   Alcohol use: No    Comment: rare - Holidays   Drug use: No   Sexual activity: Not on file  Other Topics Concern  Not on file  Social History Narrative   Not on file   Social Determinants of Health   Financial Resource Strain: Not on file  Food Insecurity: Not on file  Transportation Needs: Not on file  Physical Activity: Not on file  Stress: Not on file  Social Connections: Not on file     Family History: The patient's family history is not on file.  ROS:   Please see the history of present illness.     All other systems reviewed and are negative.  EKGs/Labs/Other Studies Reviewed:    The following studies were reviewed today:   EKG:  EKG is  ordered today.  The ekg ordered today demonstrates atrial fibrillation, right bundle branch block, heart rate 62  Recent Labs: No results found for  requested labs within last 365 days.  Recent Lipid Panel No results found for: "CHOL", "TRIG", "HDL", "CHOLHDL", "VLDL", "LDLCALC", "LDLDIRECT"  Outside lipid panel 08/12/2020 total cholesterol 171, LDL 71, HDL 64, triglycerides 182  Risk Assessment/Calculations:              Physical Exam:    VS:  BP 132/84 (BP Location: Left Arm, Patient Position: Sitting, Cuff Size: Large)   Pulse 62   Wt (!) 342 lb (155.1 kg)   SpO2 97%   BMI 45.12 kg/m     Wt Readings from Last 3 Encounters:  09/16/22 (!) 342 lb (155.1 kg)  09/08/22 (!) 342 lb (155.1 kg)  07/29/22 (!) 345 lb (156.5 kg)     GEN:  Well nourished, well developed in no acute distress HEENT: Normal NECK: No JVD; No carotid bruits CARDIAC: Irregular irregular, no murmurs, rubs, gallops RESPIRATORY:  Clear to auscultation without rales, wheezing or rhonchi  ABDOMEN: Soft, non-tender, non-distended MUSCULOSKELETAL:  No edema; No deformity  SKIN: Warm and dry NEUROLOGIC:  Alert and oriented x 3 PSYCHIATRIC:  Normal affect   ASSESSMENT:    1. Persistent atrial fibrillation (HCC)   2. Morbid obesity (HCC)   3. Elevated BP without diagnosis of hypertension   4. Mixed hyperlipidemia    PLAN:    In order of problems listed above:  Persistent atrial fibrillation, asymptomatic, CHA2DS2-VASc score of 0/1 (?htn).  EF 55-60.  Continue aspirin 81 mg daily.  Not a candidate for invasive EP procedures due to morbid obesity.  Appreciate input from EP.  Morbid obesity, low-calorie diet, weight loss advised.  Continue Ozempic, strongly encouraged to incorporate exercise regimen and low calories to his daily routine. Elevated BP, continue low salt diet, weight loss. Hyperlipidemia, continue Crestor.  Follow-up in 6 months.     Medication Adjustments/Labs and Tests Ordered: Current medicines are reviewed at length with the patient today.  Concerns regarding medicines are outlined above.  Orders Placed This Encounter  Procedures    EKG 12-Lead   No orders of the defined types were placed in this encounter.   Patient Instructions  Medication Instructions:   Your physician recommends that you continue on your current medications as directed. Please refer to the Current Medication list given to you today.  *If you need a refill on your cardiac medications before your next appointment, please call your pharmacy*   Lab Work:  None Ordered  If you have labs (blood work) drawn today and your tests are completely normal, you will receive your results only by: MyChart Message (if you have MyChart) OR A paper copy in the mail If you have any lab test that is abnormal or we need to  change your treatment, we will call you to review the results.   Testing/Procedures:  None Ordered   Follow-Up: At [facility], you and your health needs are our priority.  As part of our continuing mission to provide you with exceptional heart care, we have created designated Provider Care Teams.  These Care Teams include your primary Cardiologist (physician) and Advanced Practice Providers (APPs -  Physician Assistants and Nurse Practitioners) who all work together to provide you with the care you need, when you need it.  Your next appointment:   6 month(s)  Provider:   You may see Debbe Odea, MD or one of the following Advanced Practice Providers on your designated Care Team:   Nicolasa Ducking, NP Eula Listen, PA-C Cadence Fransico Michael, PA-C Charlsie Quest, NP    Other Instructions -None    Signed, Debbe Odea, MD  09/16/2022 11:08 AM    [facility]

## 2022-09-16 NOTE — Patient Instructions (Signed)
Medication Instructions:   Your physician recommends that you continue on your current medications as directed. Please refer to the Current Medication list given to you today.  *If you need a refill on your cardiac medications before your next appointment, please call your pharmacy*   Lab Work:  None Ordered  If you have labs (blood work) drawn today and your tests are completely normal, you will receive your results only by: MyChart Message (if you have MyChart) OR A paper copy in the mail If you have any lab test that is abnormal or we need to change your treatment, we will call you to review the results.   Testing/Procedures:  None Ordered   Follow-Up: At [facility], you and your health needs are our priority.  As part of our continuing mission to provide you with exceptional heart care, we have created designated Provider Care Teams.  These Care Teams include your primary Cardiologist (physician) and Advanced Practice Providers (APPs -  Physician Assistants and Nurse Practitioners) who all work together to provide you with the care you need, when you need it.  Your next appointment:   6 month(s)  Provider:   You may see Debbe Odea, MD or one of the following Advanced Practice Providers on your designated Care Team:   Nicolasa Ducking, NP Eula Listen, PA-C Cadence Fransico Michael, PA-C Charlsie Quest, NP    Other Instructions -None

## 2022-09-27 DIAGNOSIS — Z6841 Body Mass Index (BMI) 40.0 and over, adult: Secondary | ICD-10-CM | POA: Diagnosis not present

## 2022-09-27 DIAGNOSIS — G4733 Obstructive sleep apnea (adult) (pediatric): Secondary | ICD-10-CM | POA: Diagnosis not present

## 2022-09-27 DIAGNOSIS — I4891 Unspecified atrial fibrillation: Secondary | ICD-10-CM | POA: Diagnosis not present

## 2022-09-27 DIAGNOSIS — R7303 Prediabetes: Secondary | ICD-10-CM | POA: Diagnosis not present

## 2022-09-27 DIAGNOSIS — E785 Hyperlipidemia, unspecified: Secondary | ICD-10-CM | POA: Diagnosis not present

## 2022-09-27 DIAGNOSIS — I10 Essential (primary) hypertension: Secondary | ICD-10-CM | POA: Diagnosis not present

## 2022-10-04 DIAGNOSIS — G4733 Obstructive sleep apnea (adult) (pediatric): Secondary | ICD-10-CM | POA: Diagnosis not present

## 2022-10-08 ENCOUNTER — Ambulatory Visit: Payer: Self-pay

## 2022-10-08 DIAGNOSIS — Z0110 Encounter for examination of ears and hearing without abnormal findings: Secondary | ICD-10-CM

## 2022-10-08 NOTE — Progress Notes (Signed)
Presents to [facility] for annual hearing screen.  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  AMD

## 2022-10-26 DIAGNOSIS — B351 Tinea unguium: Secondary | ICD-10-CM | POA: Diagnosis not present

## 2022-10-27 ENCOUNTER — Encounter: Payer: 59 | Attending: Cardiology | Admitting: Dietician

## 2022-10-27 ENCOUNTER — Encounter: Payer: Self-pay | Admitting: Dietician

## 2022-10-27 VITALS — Ht 73.0 in | Wt 338.5 lb

## 2022-10-27 DIAGNOSIS — Z713 Dietary counseling and surveillance: Secondary | ICD-10-CM | POA: Diagnosis not present

## 2022-10-27 DIAGNOSIS — K219 Gastro-esophageal reflux disease without esophagitis: Secondary | ICD-10-CM | POA: Diagnosis not present

## 2022-10-27 DIAGNOSIS — I10 Essential (primary) hypertension: Secondary | ICD-10-CM | POA: Insufficient documentation

## 2022-10-27 DIAGNOSIS — I4891 Unspecified atrial fibrillation: Secondary | ICD-10-CM | POA: Insufficient documentation

## 2022-10-27 DIAGNOSIS — G4733 Obstructive sleep apnea (adult) (pediatric): Secondary | ICD-10-CM | POA: Diagnosis not present

## 2022-10-27 DIAGNOSIS — E785 Hyperlipidemia, unspecified: Secondary | ICD-10-CM | POA: Diagnosis not present

## 2022-10-27 DIAGNOSIS — R7303 Prediabetes: Secondary | ICD-10-CM | POA: Insufficient documentation

## 2022-10-27 DIAGNOSIS — Z6841 Body Mass Index (BMI) 40.0 and over, adult: Secondary | ICD-10-CM | POA: Insufficient documentation

## 2022-10-27 NOTE — Patient Instructions (Signed)
Choose lower calorie foods at restaurants, or make one meal into 2.  Try increasing meals at home, start with convenient, quick meals such as a sandwich or soup.  Include low carb veggies with meals and use veggies to stretch portions of starches or meats.  Include a healthy evening snack about 4 hours after dinner.

## 2022-10-27 NOTE — Progress Notes (Signed)
Medical Nutrition Therapy: Visit start time: 1100  end time: 1210  Assessment:   Referral Diagnosis: obesity, HTN, prediabetes, HLD Other medical history/ diagnoses: recent diagnosis of A-fib; GERD, OSA Psychosocial issues/ stress concerns: none  Medications, supplements: reconciled list in medical record    Current weight: 338.5lbs Height: 6'1" BMI: 44.66   Progress and evaluation:  Lost about 20lbs when taking ozempic for over 1 year, but regained.  No previous diets/ programs.  No recent diet changes. Voices goal for weight loss to improve energy, mobility, reduce health risk. Some physical activity limited due to shoulder arthritis Labwork results: 09/21/21 HbA1C 5.8%, 03/24/22 5.5% Food allergies: none Does not like leafy greens Special diet practices: none Next PCP appt is 03/2023    Dietary Intake:  Usual eating pattern includes 2 meals and 1-2 snacks per day. Dining out frequency: 10 meals per week. Who plans meals/ buys groceries? Spouse Who prepares meals? 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   Intervention:   Nutrition Care Education:   Basic nutrition: basic food groups; appropriate nutrient balance; appropriate meal and snack schedule; general nutrition guidelines    Weight control: importance of low sugar and low fat choices; portion control strategies; estimated energy needs for weight loss at 1900 kcal, provided guidance for 45% CHO, 25% pro, 30% fat; role of physical activity Advanced nutrition:  cooking  techniques; dining out prediabetes:  appropriate meal and snack schedule; appropriate carb intake and balance, healthy carb choices Hypertension: identifying high sodium foods, goal for sodium intake; identifying food sources of potassium, magnesium Hyperlipidemia: healthy and unhealthy fats   Other intervention notes: Patient wants to lose weight, voices readiness for some change but confidence in ability to make significant changes is low, especially with dining out. Established goals with direction from patient. Patient declined scheduling follow up at this time, patient will schedule later if needed.   Nutritional Diagnosis:  [place]-2.2 Altered nutrition-related laboratory As related to hyperlipidemia, prediabetes, HTN.  As evidenced by history of elevated HbA1C, cholesterol controlled with medication, BP controlled with medications. [place]-3.3 Overweight/obesity As related to excess calories and inadequate physical activity.  As evidenced by patient with current BMI of 44.66.   Education Materials given:  Designer, industrial/product with food lists, sample meal pattern Dining out resource [employer] handout Visit summary with goals/ instructions   Learner/ who was taught:  Patient  Spouse/ partner  Level of understanding: Verbalizes/ demonstrates competency  Demonstrated degree of understanding via:   Teach back Learning barriers: None  Willingness to learn/ readiness for change: Hesitance, contemplating change  Monitoring and Evaluation:  Dietary intake, exercise, and body weight      follow up: prn

## 2022-12-08 DIAGNOSIS — G4733 Obstructive sleep apnea (adult) (pediatric): Secondary | ICD-10-CM | POA: Diagnosis not present

## 2023-01-12 DIAGNOSIS — G4733 Obstructive sleep apnea (adult) (pediatric): Secondary | ICD-10-CM | POA: Diagnosis not present

## 2023-02-12 DIAGNOSIS — G4733 Obstructive sleep apnea (adult) (pediatric): Secondary | ICD-10-CM | POA: Diagnosis not present

## 2023-03-14 DIAGNOSIS — G4733 Obstructive sleep apnea (adult) (pediatric): Secondary | ICD-10-CM | POA: Diagnosis not present

## 2023-03-15 DIAGNOSIS — D225 Melanocytic nevi of trunk: Secondary | ICD-10-CM | POA: Diagnosis not present

## 2023-03-15 DIAGNOSIS — D2262 Melanocytic nevi of left upper limb, including shoulder: Secondary | ICD-10-CM | POA: Diagnosis not present

## 2023-03-15 DIAGNOSIS — D2261 Melanocytic nevi of right upper limb, including shoulder: Secondary | ICD-10-CM | POA: Diagnosis not present

## 2023-03-15 DIAGNOSIS — L821 Other seborrheic keratosis: Secondary | ICD-10-CM | POA: Diagnosis not present

## 2023-03-15 DIAGNOSIS — L570 Actinic keratosis: Secondary | ICD-10-CM | POA: Diagnosis not present

## 2023-03-21 ENCOUNTER — Encounter: Payer: Self-pay | Admitting: Cardiology

## 2023-03-21 ENCOUNTER — Ambulatory Visit: Payer: 59 | Attending: Cardiology | Admitting: Cardiology

## 2023-03-21 VITALS — BP 156/108 | HR 87 | Ht 73.0 in | Wt 349.0 lb

## 2023-03-21 DIAGNOSIS — I4819 Other persistent atrial fibrillation: Secondary | ICD-10-CM | POA: Diagnosis not present

## 2023-03-21 DIAGNOSIS — I10 Essential (primary) hypertension: Secondary | ICD-10-CM | POA: Diagnosis not present

## 2023-03-21 DIAGNOSIS — E782 Mixed hyperlipidemia: Secondary | ICD-10-CM

## 2023-03-21 MED ORDER — DILTIAZEM HCL ER COATED BEADS 120 MG PO CP24: 120.0000 mg | ORAL_CAPSULE | Freq: Every day | ORAL | 3 refills | Status: DC

## 2023-03-21 NOTE — Patient Instructions (Signed)
Medication Instructions:   START Diltiazem - take one tablet ( 120mg ) by mouth daily.    *If you need a refill on your cardiac medications before your next appointment, please call your pharmacy*   Lab Work:  None Ordered  If you have labs (blood work) drawn today and your tests are completely normal, you will receive your results only by: MyChart Message (if you have MyChart) OR A paper copy in the mail If you have any lab test that is abnormal or we need to change your treatment, we will call you to review the results.   Testing/Procedures:  1., None ordered   Follow-Up: At [facility], you and your health needs are our priority.  As part of our continuing mission to provide you with exceptional heart care, we have created designated Provider Care Teams.  These Care Teams include your primary Cardiologist (physician) and Advanced Practice Providers (APPs -  Physician Assistants and Nurse Practitioners) who all work together to provide you with the care you need, when you need it.  We recommend signing up for the patient portal called "MyChart".  Sign up information is provided on this After Visit Summary.  MyChart is used to connect with patients for Virtual Visits (Telemedicine).  Patients are able to view lab/test results, encounter notes, upcoming appointments, etc.  Non-urgent messages can be sent to your provider as well.   To learn more about what you can do with MyChart, go to [email or web address].    Your next appointment:   5 month(s)  Provider:   You may see Debbe Odea, MD or one of the following Advanced Practice Providers on your designated Care Team:   Nicolasa Ducking, NP Eula Listen, PA-C Cadence Fransico Michael, PA-C Charlsie Quest, NP Carlos Levering, NP

## 2023-03-21 NOTE — Progress Notes (Signed)
Cardiology Office Note:    Date:  03/21/2023   ID:  Jonathon Derrick., DOB 1961-04-12, MRN [id number]  PCP:  Mick Sell, MD   [facility] Providers Cardiologist:  Debbe Odea, MD Electrophysiologist:  Lanier Prude, MD     Referring MD: Mick Sell, MD   Chief Complaint  Patient presents with   Follow-up    Patient denies new or acute cardiac problems/concerns today.      History of Present Illness:    Jonathon Raylon Thagard. is a 62 y.o. male with a hx of hyperlipidemia, persistent atrial fibrillation, GERD, OSA on CPAP presenting for follow-up.  Feels well, denies palpitations, chest pain or shortness of breath.  Takes aspirin 81 mg daily as prescribed.  His insurance company will not cover Ozempic.  States putting out some weight, does not check BP frequently at home.   Past Medical History:  Diagnosis Date   Arthritis    Right shoulder   GERD (gastroesophageal reflux disease)    Hyperlipidemia    Pre-diabetes    Sleep apnea    CPAP    Past Surgical History:  Procedure Laterality Date   CATARACT EXTRACTION W/PHACO Right 02/12/2019   Procedure: CATARACT EXTRACTION PHACO AND INTRAOCULAR LENS PLACEMENT (IOC) RIGHT 00:44.3            13.1%          5.86;  Surgeon: Nevada Crane, MD;  Location: [facility];  Service: Ophthalmology;  Laterality: Right;  sleep apnea   CATARACT EXTRACTION W/PHACO Left 07/19/2022   Procedure: CATARACT EXTRACTION PHACO AND INTRAOCULAR LENS PLACEMENT (IOC) LEFT  2.50  00:20.0;  Surgeon: Nevada Crane, MD;  Location: [facility];  Service: Ophthalmology;  Laterality: Left;   colonoscopy and upper endoscopy     COLONOSCOPY WITH PROPOFOL N/A 02/16/2016   Procedure: COLONOSCOPY WITH PROPOFOL;  Surgeon: Christena Deem, MD;  Location: [facility] ENDOSCOPY;  Service: Endoscopy;  Laterality: N/A;    Current Medications: Current Meds  Medication Sig   aspirin EC 81 MG tablet Take 81 mg by mouth daily.    diltiazem (CARDIZEM CD) 120 MG 24 hr capsule Take 1 capsule (120 mg total) by mouth daily.   GLUCOSAMINE-FISH OIL-EPA-DHA PO Take by mouth.   Multiple Vitamin (MULTIVITAMIN) tablet Take 1 tablet by mouth daily.   omeprazole (PRILOSEC) 20 MG capsule Take 1 capsule (20 mg total) by mouth daily.   rosuvastatin (CRESTOR) 10 MG tablet Take 10 mg by mouth daily.   sildenafil (VIAGRA) 100 MG tablet Take 100 mg by mouth daily as needed for erectile dysfunction.   vitamin B-12 (CYANOCOBALAMIN) 1000 MCG tablet Take 1,000 mcg by mouth daily.     Allergies:   Patient has no known allergies.   Social History   Socioeconomic History   Marital status: Married    Spouse name: Not on file   Number of children: Not on file   Years of education: Not on file   Highest education level: Not on file  Occupational History   Not on file  Tobacco Use   Smoking status: Never   Smokeless tobacco: Never  Vaping Use   Vaping status: Never Used  Substance and Sexual Activity   Alcohol use: No    Comment: rare - Holidays   Drug use: No   Sexual activity: Not on file  Other Topics Concern   Not on file  Social History Narrative   Not on file  Social Determinants of Health   Financial Resource Strain: Not on file  Food Insecurity: Not on file  Transportation Needs: Not on file  Physical Activity: Not on file  Stress: Not on file  Social Connections: Not on file     Family History: The patient's family history is not on file.  ROS:   Please see the history of present illness.     All other systems reviewed and are negative.  EKGs/Labs/Other Studies Reviewed:    The following studies were reviewed today:   EKG Interpretation Date/Time:  Monday March 21 2023 08:25:13 EST Ventricular Rate:  87 PR Interval:    QRS Duration:  148 QT Interval:  404 QTC Calculation: 486 R Axis:   -1  Text Interpretation: Atrial fibrillation Right bundle branch block Confirmed by Debbe Odea  [phone]) on 03/21/2023 8:31:49 AM    Recent Labs: No results found for requested labs within last 365 days.  Recent Lipid Panel No results found for: "CHOL", "TRIG", "HDL", "CHOLHDL", "VLDL", "LDLCALC", "LDLDIRECT"  Outside lipid panel 08/12/2020 total cholesterol 171, LDL 71, HDL 64, triglycerides 182  Risk Assessment/Calculations:    HYPERTENSION CONTROL Vitals:   03/21/23 0820 03/21/23 0827  BP: (!) 154/102 (!) 156/108    The patient's blood pressure is elevated above target today.  In order to address the patient's elevated BP: A new medication was prescribed today.         Physical Exam:    VS:  BP (!) 156/108 (BP Location: Left Arm, Patient Position: Sitting, Cuff Size: Large)   Pulse 87   Ht 6\' 1"  (1.854 m)   Wt (!) 349 lb (158.3 kg)   SpO2 96%   BMI 46.04 kg/m     Wt Readings from Last 3 Encounters:  03/21/23 (!) 349 lb (158.3 kg)  10/27/22 (!) 338 lb 8 oz (153.5 kg)  09/16/22 (!) 342 lb (155.1 kg)     GEN:  Well nourished, well developed in no acute distress HEENT: Normal NECK: No JVD; No carotid bruits CARDIAC: Irregular irregular, no murmurs, rubs, gallops RESPIRATORY:  Clear to auscultation without rales, wheezing or rhonchi  ABDOMEN: Soft, non-tender, non-distended MUSCULOSKELETAL:  No edema; No deformity  SKIN: Warm and dry NEUROLOGIC:  Alert and oriented x 3 PSYCHIATRIC:  Normal affect   ASSESSMENT:    1. Persistent atrial fibrillation (HCC)   2. Primary hypertension   3. Morbid obesity (HCC)   4. Mixed hyperlipidemia    PLAN:    In order of problems listed above:  Persistent atrial fibrillation, asymptomatic, CHA2DS2-VASc score of 1 (htn).  EF 55-60.  Heart rate controlled off AV nodal agents.  Continue aspirin 81 mg daily.  Previously evaluated by EP, deemed not a candidate for invasive procedure.  Plan to start NOAC when CHA2DS2-VASc score is 2.  Patient turns 63 next year. Hypertension, BP elevated.  Start Cardizem 120 mg daily.  Low-salt  diet advised. Morbid obesity, low-calorie diet, weight loss advised.   Hyperlipidemia, continue Crestor 10 mg daily.  Follow-up in 5 months.     Medication Adjustments/Labs and Tests Ordered: Current medicines are reviewed at length with the patient today.  Concerns regarding medicines are outlined above.  Orders Placed This Encounter  Procedures   EKG 12-Lead   Meds ordered this encounter  Medications   diltiazem (CARDIZEM CD) 120 MG 24 hr capsule    Sig: Take 1 capsule (120 mg total) by mouth daily.    Dispense:  90 capsule  Refill:  3    Patient Instructions  Medication Instructions:   START Diltiazem - take one tablet ( 120mg ) by mouth daily.    *If you need a refill on your cardiac medications before your next appointment, please call your pharmacy*   Lab Work:  None Ordered  If you have labs (blood work) drawn today and your tests are completely normal, you will receive your results only by: MyChart Message (if you have MyChart) OR A paper copy in the mail If you have any lab test that is abnormal or we need to change your treatment, we will call you to review the results.   Testing/Procedures:  1., None ordered   Follow-Up: At [facility], you and your health needs are our priority.  As part of our continuing mission to provide you with exceptional heart care, we have created designated Provider Care Teams.  These Care Teams include your primary Cardiologist (physician) and Advanced Practice Providers (APPs -  Physician Assistants and Nurse Practitioners) who all work together to provide you with the care you need, when you need it.  We recommend signing up for the patient portal called "MyChart".  Sign up information is provided on this After Visit Summary.  MyChart is used to connect with patients for Virtual Visits (Telemedicine).  Patients are able to view lab/test results, encounter notes, upcoming appointments, etc.  Non-urgent messages can be  sent to your provider as well.   To learn more about what you can do with MyChart, go to [email or web address].    Your next appointment:   5 month(s)  Provider:   You may see Debbe Odea, MD or one of the following Advanced Practice Providers on your designated Care Team:   Nicolasa Ducking, NP Eula Listen, PA-C Cadence Fransico Michael, PA-C Charlsie Quest, NP Carlos Levering, NP   Signed, Debbe Odea, MD  03/21/2023 9:33 AM    [facility]

## 2023-03-29 DIAGNOSIS — I4891 Unspecified atrial fibrillation: Secondary | ICD-10-CM | POA: Diagnosis not present

## 2023-03-29 DIAGNOSIS — Z6841 Body Mass Index (BMI) 40.0 and over, adult: Secondary | ICD-10-CM | POA: Diagnosis not present

## 2023-03-29 DIAGNOSIS — R7303 Prediabetes: Secondary | ICD-10-CM | POA: Diagnosis not present

## 2023-03-29 DIAGNOSIS — E785 Hyperlipidemia, unspecified: Secondary | ICD-10-CM | POA: Diagnosis not present

## 2023-03-29 DIAGNOSIS — Z713 Dietary counseling and surveillance: Secondary | ICD-10-CM | POA: Diagnosis not present

## 2023-03-29 DIAGNOSIS — G4733 Obstructive sleep apnea (adult) (pediatric): Secondary | ICD-10-CM | POA: Diagnosis not present

## 2023-03-29 DIAGNOSIS — I10 Essential (primary) hypertension: Secondary | ICD-10-CM | POA: Diagnosis not present

## 2023-03-29 DIAGNOSIS — Z0000 Encounter for general adult medical examination without abnormal findings: Secondary | ICD-10-CM | POA: Diagnosis not present

## 2023-04-02 DIAGNOSIS — G4733 Obstructive sleep apnea (adult) (pediatric): Secondary | ICD-10-CM | POA: Diagnosis not present

## 2023-04-14 DIAGNOSIS — G4733 Obstructive sleep apnea (adult) (pediatric): Secondary | ICD-10-CM | POA: Diagnosis not present

## 2023-04-18 DIAGNOSIS — G4733 Obstructive sleep apnea (adult) (pediatric): Secondary | ICD-10-CM | POA: Diagnosis not present

## 2023-04-25 ENCOUNTER — Encounter: Payer: Self-pay | Admitting: Emergency Medicine

## 2023-04-25 ENCOUNTER — Emergency Department (HOSPITAL_COMMUNITY): Payer: 59 | Admitting: Anesthesiology

## 2023-04-25 ENCOUNTER — Other Ambulatory Visit (HOSPITAL_COMMUNITY): Payer: 59

## 2023-04-25 ENCOUNTER — Emergency Department (HOSPITAL_COMMUNITY): Payer: 59

## 2023-04-25 ENCOUNTER — Inpatient Hospital Stay (HOSPITAL_COMMUNITY)
Admission: EM | Admit: 2023-04-25 | Discharge: 2023-04-27 | DRG: 024 | Disposition: A | Discharge status: Home / Self Care | Payer: 59 | Source: Other Acute Inpatient Hospital | Attending: Neurology | Admitting: Neurology

## 2023-04-25 ENCOUNTER — Inpatient Hospital Stay (HOSPITAL_COMMUNITY): Payer: 59

## 2023-04-25 ENCOUNTER — Emergency Department: Payer: 59

## 2023-04-25 ENCOUNTER — Other Ambulatory Visit: Payer: Self-pay

## 2023-04-25 ENCOUNTER — Inpatient Hospital Stay: Admit: 2023-04-25 | Payer: 59

## 2023-04-25 ENCOUNTER — Emergency Department
Admission: EM | Admit: 2023-04-25 | Discharge: 2023-04-25 | Disposition: A | Discharge status: Other Acute Inpatient Hospital | Payer: 59 | Attending: Emergency Medicine | Admitting: Emergency Medicine

## 2023-04-25 ENCOUNTER — Encounter (HOSPITAL_COMMUNITY)
Admission: EM | Disposition: A | Discharge status: Home / Self Care | Payer: Self-pay | Source: Home / Self Care | Attending: Neurology

## 2023-04-25 ENCOUNTER — Ambulatory Visit: Payer: Self-pay | Admitting: Physician Assistant

## 2023-04-25 ENCOUNTER — Encounter: Payer: Self-pay | Admitting: Physician Assistant

## 2023-04-25 VITALS — BP 157/92 | HR 82 | Resp 20

## 2023-04-25 DIAGNOSIS — Y900 Blood alcohol level of less than 20 mg/100 ml: Secondary | ICD-10-CM | POA: Insufficient documentation

## 2023-04-25 DIAGNOSIS — E66813 Obesity, class 3: Secondary | ICD-10-CM | POA: Diagnosis present

## 2023-04-25 DIAGNOSIS — R4701 Aphasia: Secondary | ICD-10-CM | POA: Diagnosis present

## 2023-04-25 DIAGNOSIS — R0689 Other abnormalities of breathing: Secondary | ICD-10-CM | POA: Diagnosis not present

## 2023-04-25 DIAGNOSIS — R29710 NIHSS score 10: Secondary | ICD-10-CM | POA: Diagnosis present

## 2023-04-25 DIAGNOSIS — Z7901 Long term (current) use of anticoagulants: Secondary | ICD-10-CM

## 2023-04-25 DIAGNOSIS — Z20822 Contact with and (suspected) exposure to covid-19: Secondary | ICD-10-CM | POA: Diagnosis not present

## 2023-04-25 DIAGNOSIS — R7303 Prediabetes: Secondary | ICD-10-CM | POA: Diagnosis present

## 2023-04-25 DIAGNOSIS — M19011 Primary osteoarthritis, right shoulder: Secondary | ICD-10-CM | POA: Diagnosis present

## 2023-04-25 DIAGNOSIS — K219 Gastro-esophageal reflux disease without esophagitis: Secondary | ICD-10-CM | POA: Diagnosis present

## 2023-04-25 DIAGNOSIS — I639 Cerebral infarction, unspecified: Secondary | ICD-10-CM | POA: Insufficient documentation

## 2023-04-25 DIAGNOSIS — R7309 Other abnormal glucose: Secondary | ICD-10-CM | POA: Diagnosis not present

## 2023-04-25 DIAGNOSIS — I6522 Occlusion and stenosis of left carotid artery: Secondary | ICD-10-CM | POA: Diagnosis not present

## 2023-04-25 DIAGNOSIS — R0902 Hypoxemia: Secondary | ICD-10-CM | POA: Diagnosis not present

## 2023-04-25 DIAGNOSIS — I63312 Cerebral infarction due to thrombosis of left middle cerebral artery: Secondary | ICD-10-CM | POA: Diagnosis not present

## 2023-04-25 DIAGNOSIS — F418 Other specified anxiety disorders: Secondary | ICD-10-CM | POA: Diagnosis not present

## 2023-04-25 DIAGNOSIS — E785 Hyperlipidemia, unspecified: Secondary | ICD-10-CM | POA: Diagnosis present

## 2023-04-25 DIAGNOSIS — I6389 Other cerebral infarction: Secondary | ICD-10-CM | POA: Diagnosis not present

## 2023-04-25 DIAGNOSIS — I4891 Unspecified atrial fibrillation: Secondary | ICD-10-CM | POA: Diagnosis present

## 2023-04-25 DIAGNOSIS — Z7982 Long term (current) use of aspirin: Secondary | ICD-10-CM | POA: Insufficient documentation

## 2023-04-25 DIAGNOSIS — I63412 Cerebral infarction due to embolism of left middle cerebral artery: Principal | ICD-10-CM | POA: Diagnosis present

## 2023-04-25 DIAGNOSIS — I10 Essential (primary) hypertension: Secondary | ICD-10-CM | POA: Diagnosis not present

## 2023-04-25 DIAGNOSIS — I6602 Occlusion and stenosis of left middle cerebral artery: Secondary | ICD-10-CM | POA: Diagnosis not present

## 2023-04-25 DIAGNOSIS — G4733 Obstructive sleep apnea (adult) (pediatric): Secondary | ICD-10-CM | POA: Diagnosis not present

## 2023-04-25 DIAGNOSIS — M1990 Unspecified osteoarthritis, unspecified site: Secondary | ICD-10-CM | POA: Diagnosis not present

## 2023-04-25 DIAGNOSIS — G4730 Sleep apnea, unspecified: Secondary | ICD-10-CM | POA: Diagnosis present

## 2023-04-25 DIAGNOSIS — Z79899 Other long term (current) drug therapy: Secondary | ICD-10-CM

## 2023-04-25 DIAGNOSIS — G8191 Hemiplegia, unspecified affecting right dominant side: Secondary | ICD-10-CM | POA: Diagnosis present

## 2023-04-25 DIAGNOSIS — Z6841 Body Mass Index (BMI) 40.0 and over, adult: Secondary | ICD-10-CM

## 2023-04-25 DIAGNOSIS — I63512 Cerebral infarction due to unspecified occlusion or stenosis of left middle cerebral artery: Secondary | ICD-10-CM

## 2023-04-25 DIAGNOSIS — Z9282 Status post administration of tPA (rtPA) in a different facility within the last 24 hours prior to admission to current facility: Secondary | ICD-10-CM

## 2023-04-25 HISTORY — PX: RADIOLOGY WITH ANESTHESIA: SHX6223

## 2023-04-25 HISTORY — PX: IR CT HEAD LTD: IMG2386

## 2023-04-25 HISTORY — PX: IR PERCUTANEOUS ART THROMBECTOMY/INFUSION INTRACRANIAL INC DIAG ANGIO: IMG6087

## 2023-04-25 LAB — CBC WITH DIFFERENTIAL/PLATELET
Abs Immature Granulocytes: 0.02 10*3/uL (ref 0.00–0.07)
Basophils Absolute: 0.1 10*3/uL (ref 0.0–0.1)
Basophils Relative: 1 %
Eosinophils Absolute: 0.2 10*3/uL (ref 0.0–0.5)
Eosinophils Relative: 3 %
HCT: 48.2 % (ref 39.0–52.0)
Hemoglobin: 16.1 g/dL (ref 13.0–17.0)
Immature Granulocytes: 0 %
Lymphocytes Relative: 27 %
Lymphs Abs: 1.8 10*3/uL (ref 0.7–4.0)
MCH: 28.2 pg (ref 26.0–34.0)
MCHC: 33.4 g/dL (ref 30.0–36.0)
MCV: 84.4 fL (ref 80.0–100.0)
Monocytes Absolute: 0.6 10*3/uL (ref 0.1–1.0)
Monocytes Relative: 9 %
Neutro Abs: 4 10*3/uL (ref 1.7–7.7)
Neutrophils Relative %: 60 %
Platelets: 187 10*3/uL (ref 150–400)
RBC: 5.71 MIL/uL (ref 4.22–5.81)
RDW: 13.1 % (ref 11.5–15.5)
WBC: 6.6 10*3/uL (ref 4.0–10.5)
nRBC: 0 % (ref 0.0–0.2)

## 2023-04-25 LAB — COMPREHENSIVE METABOLIC PANEL
ALT: 17 U/L (ref 0–44)
AST: 25 U/L (ref 15–41)
Albumin: 4.2 g/dL (ref 3.5–5.0)
Alkaline Phosphatase: 55 U/L (ref 38–126)
Anion gap: 13 (ref 5–15)
BUN: 18 mg/dL (ref 8–23)
CO2: 24 mmol/L (ref 22–32)
Calcium: 9.2 mg/dL (ref 8.9–10.3)
Chloride: 103 mmol/L (ref 98–111)
Creatinine, Ser: 1.22 mg/dL (ref 0.61–1.24)
GFR, Estimated: >60 mL/min (ref >60)
Glucose, Bld: 126 mg/dL — ABNORMAL HIGH (ref 70–99)
Potassium: 4.3 mmol/L (ref 3.5–5.1)
Sodium: 140 mmol/L (ref 135–145)
Total Bilirubin: 1.2 mg/dL (ref 0.0–1.2)
Total Protein: 7.6 g/dL (ref 6.5–8.1)

## 2023-04-25 LAB — ECHOCARDIOGRAM COMPLETE
AR max vel: 3.16 cm2
AV Peak grad: 9.9 mm[Hg]
Ao pk vel: 1.57 m/s
Area-P 1/2: 3.7 cm2
S' Lateral: 4.1 cm
Weight: 5360 [oz_av]

## 2023-04-25 LAB — PROTIME-INR
INR: 1 (ref 0.8–1.2)
Prothrombin Time: 13.4 s (ref 11.4–15.2)

## 2023-04-25 LAB — CBG MONITORING, ED: Glucose-Capillary: 112 mg/dL — ABNORMAL HIGH (ref 70–99)

## 2023-04-25 LAB — ETHANOL: Alcohol, Ethyl (B): <10 mg/dL (ref <10)

## 2023-04-25 LAB — RESP PANEL BY RT-PCR (RSV, FLU A&B, COVID)  RVPGX2
Influenza A by PCR: NEGATIVE
Influenza B by PCR: NEGATIVE
Resp Syncytial Virus by PCR: NEGATIVE
SARS Coronavirus 2 by RT PCR: NEGATIVE

## 2023-04-25 LAB — APTT: aPTT: 25 s (ref 24–36)

## 2023-04-25 LAB — MRSA NEXT GEN BY PCR, NASAL: MRSA by PCR Next Gen: NOT DETECTED

## 2023-04-25 LAB — HEMOGLOBIN A1C
Hgb A1c MFr Bld: 6.2 % — ABNORMAL HIGH (ref 4.8–5.6)
Mean Plasma Glucose: 131.24 mg/dL

## 2023-04-25 LAB — HIV ANTIBODY (ROUTINE TESTING W REFLEX): HIV Screen 4th Generation wRfx: NONREACTIVE

## 2023-04-25 SURGERY — RADIOLOGY WITH ANESTHESIA
Anesthesia: General

## 2023-04-25 MED ORDER — SENNOSIDES-DOCUSATE SODIUM 8.6-50 MG PO TABS: 1.0000 | ORAL_TABLET | Freq: Every evening | ORAL | Status: DC | PRN

## 2023-04-25 MED ORDER — DEXAMETHASONE SODIUM PHOSPHATE 10 MG/ML IJ SOLN
INTRAMUSCULAR | Status: DC | PRN
  Administered 2023-04-25: 8 mg via INTRAVENOUS

## 2023-04-25 MED ORDER — PERFLUTREN LIPID MICROSPHERE
1.0000 mL | INTRAVENOUS | Status: AC | PRN
  Administered 2023-04-25: 4 mL via INTRAVENOUS

## 2023-04-25 MED ORDER — ACETAMINOPHEN 325 MG PO TABS: 650.0000 mg | ORAL_TABLET | ORAL | Status: DC | PRN

## 2023-04-25 MED ORDER — ACETAMINOPHEN 650 MG RE SUPP: 650.0000 mg | RECTAL | Status: DC | PRN

## 2023-04-25 MED ORDER — SODIUM CHLORIDE 0.9 % IV SOLN: INTRAVENOUS | Status: DC

## 2023-04-25 MED ORDER — ACETAMINOPHEN 160 MG/5ML PO SOLN: 650.0000 mg | ORAL | Status: DC | PRN

## 2023-04-25 MED ORDER — SODIUM CHLORIDE 0.9 % IV SOLN: INTRAVENOUS | Status: AC

## 2023-04-25 MED ORDER — LABETALOL HCL 5 MG/ML IV SOLN: 20.0000 mg | Freq: Once | INTRAVENOUS | Status: DC

## 2023-04-25 MED ORDER — FENTANYL CITRATE (PF) 250 MCG/5ML IJ SOLN
INTRAMUSCULAR | Status: DC | PRN
  Administered 2023-04-25: 100 ug via INTRAVENOUS

## 2023-04-25 MED ORDER — LABETALOL HCL 5 MG/ML IV SOLN: 10.0000 mg | INTRAVENOUS | Status: DC | PRN

## 2023-04-25 MED ORDER — CHLORHEXIDINE GLUCONATE CLOTH 2 % EX PADS
6.0000 | MEDICATED_PAD | Freq: Every day | CUTANEOUS | Status: DC
  Administered 2023-04-25 – 2023-04-26 (×2): 6 via TOPICAL

## 2023-04-25 MED ORDER — CLEVIDIPINE BUTYRATE 0.5 MG/ML IV EMUL
INTRAVENOUS | Status: AC
  Administered 2023-04-25: 2 mg/h via INTRAVENOUS
  Filled 2023-04-25: qty 100

## 2023-04-25 MED ORDER — LIDOCAINE 2% (20 MG/ML) 5 ML SYRINGE
INTRAMUSCULAR | Status: DC | PRN
  Administered 2023-04-25: 100 mg via INTRAVENOUS

## 2023-04-25 MED ORDER — PANTOPRAZOLE SODIUM 40 MG IV SOLR
40.0000 mg | Freq: Every day | INTRAVENOUS | Status: DC
  Administered 2023-04-26: 40 mg via INTRAVENOUS
  Filled 2023-04-25: qty 10

## 2023-04-25 MED ORDER — PROPOFOL 10 MG/ML IV BOLUS
INTRAVENOUS | Status: DC | PRN
  Administered 2023-04-25: 150 mg via INTRAVENOUS
  Administered 2023-04-25: 50 mg via INTRAVENOUS

## 2023-04-25 MED ORDER — TENECTEPLASE FOR STROKE
0.2500 mg/kg | PACK | Freq: Once | INTRAVENOUS | Status: AC
  Administered 2023-04-25: 25 mg via INTRAVENOUS

## 2023-04-25 MED ORDER — IOHEXOL 300 MG/ML  SOLN
150.0000 mL | Freq: Once | INTRAMUSCULAR | Status: AC | PRN
  Administered 2023-04-25: 75 mL via INTRA_ARTERIAL

## 2023-04-25 MED ORDER — ONDANSETRON HCL 4 MG/2ML IJ SOLN
INTRAMUSCULAR | Status: DC | PRN
  Administered 2023-04-25: 4 mg via INTRAVENOUS

## 2023-04-25 MED ORDER — PHENYLEPHRINE HCL-NACL 20-0.9 MG/250ML-% IV SOLN
INTRAVENOUS | Status: DC | PRN
  Administered 2023-04-25: 40 ug/min via INTRAVENOUS

## 2023-04-25 MED ORDER — SODIUM CHLORIDE (PF) 0.9 % IJ SOLN
INTRAVENOUS | Status: AC | PRN
  Administered 2023-04-25 (×3): 25 ug via INTRA_ARTERIAL

## 2023-04-25 MED ORDER — GLYCOPYRROLATE 0.2 MG/ML IJ SOLN
INTRAMUSCULAR | Status: DC | PRN
  Administered 2023-04-25 (×2): .1 mg via INTRAVENOUS

## 2023-04-25 MED ORDER — CLEVIDIPINE BUTYRATE 0.5 MG/ML IV EMUL
0.0000 mg/h | INTRAVENOUS | Status: AC
  Administered 2023-04-25: 28 mg/h via INTRAVENOUS
  Administered 2023-04-25: 30 mg/h via INTRAVENOUS
  Administered 2023-04-25: 26 mg/h via INTRAVENOUS
  Administered 2023-04-25: 32 mg/h via INTRAVENOUS
  Administered 2023-04-25: 28 mg/h via INTRAVENOUS
  Administered 2023-04-26: 20 mg/h via INTRAVENOUS
  Administered 2023-04-26: 3 mg/h via INTRAVENOUS
  Administered 2023-04-26: 14 mg/h via INTRAVENOUS
  Administered 2023-04-26: 16 mg/h via INTRAVENOUS
  Filled 2023-04-25 (×2): qty 100
  Filled 2023-04-25 (×2): qty 50
  Filled 2023-04-25: qty 100
  Filled 2023-04-25 (×3): qty 50
  Filled 2023-04-25: qty 100

## 2023-04-25 MED ORDER — SUCCINYLCHOLINE CHLORIDE 200 MG/10ML IV SOSY
PREFILLED_SYRINGE | INTRAVENOUS | Status: DC | PRN
  Administered 2023-04-25: 120 mg via INTRAVENOUS

## 2023-04-25 MED ORDER — SUGAMMADEX SODIUM 200 MG/2ML IV SOLN
INTRAVENOUS | Status: DC | PRN
  Administered 2023-04-25: 400 mg via INTRAVENOUS

## 2023-04-25 MED ORDER — PERFLUTREN LIPID MICROSPHERE: 1.0000 mL | INTRAVENOUS | Status: DC | PRN

## 2023-04-25 MED ORDER — STROKE: EARLY STAGES OF RECOVERY BOOK
Freq: Once | Status: AC
  Filled 2023-04-25: qty 1

## 2023-04-25 MED ORDER — IOHEXOL 350 MG/ML SOLN
75.0000 mL | Freq: Once | INTRAVENOUS | Status: AC | PRN
  Administered 2023-04-25: 75 mL via INTRAVENOUS

## 2023-04-25 MED ORDER — ROCURONIUM BROMIDE 10 MG/ML (PF) SYRINGE
PREFILLED_SYRINGE | INTRAVENOUS | Status: DC | PRN
  Administered 2023-04-25: 20 mg via INTRAVENOUS
  Administered 2023-04-25: 80 mg via INTRAVENOUS

## 2023-04-25 MED ORDER — CLEVIDIPINE BUTYRATE 0.5 MG/ML IV EMUL: 0.0000 mg/h | INTRAVENOUS | Status: DC

## 2023-04-25 NOTE — Code Documentation (Signed)
 Jonathon Evans is a 63 yr old male arriving to [facility] via [facility] from [facility]. He was LKW this morning at 0700, after which he began having trouble speaking and using tools. He was given TNK at [facility] at 0957. Initial NIHSS 7. CTA showed left M1 occlusion.    Pt arrived at 1100. He was re-examined by stroke team. New NIHSS 2, disoriented to month and moderate aphasia. Pt taken to IR at 1103. Consent given to IR staff by Dr. Michaela. Handoff with Cathlyn MOTTO RN complete.

## 2023-04-25 NOTE — Anesthesia Procedure Notes (Signed)
 Procedure Name: Intubation Date/Time: 04/25/2023 11:18 AM  Performed by: Boyce Shilling, CRNAPre-anesthesia Checklist: Patient identified, Emergency Drugs available, Suction available, Timeout performed and Patient being monitored Patient Re-evaluated:Patient Re-evaluated prior to induction Oxygen Delivery Method: Circle system utilized Preoxygenation: Pre-oxygenation with 100% oxygen Induction Type: IV induction and Rapid sequence Ventilation: Two handed mask ventilation required Laryngoscope Size: Glidescope and 4 Grade View: Grade I Tube type: Oral Tube size: 7.5 mm Number of attempts: 1 Airway Equipment and Method: Rigid stylet and Video-laryngoscopy Placement Confirmation: ETT inserted through vocal cords under direct vision, positive ETCO2, CO2 detector and breath sounds checked- equal and bilateral Secured at: 23 cm Tube secured with: Tape Dental Injury: Teeth and Oropharynx as per pre-operative assessment

## 2023-04-25 NOTE — ED Notes (Signed)
 [facility]  CALLED  FOR  TRANSPORT  TO  IR  AT  [facility]

## 2023-04-25 NOTE — Consult Note (Signed)
 NEUROLOGY CONSULT NOTE   Date of service: April 25, 2023 Patient Name: Jonathon Evans. MRN:  [id number] DOB:  1960/12/03 Chief Complaint: acute onset aphasia Requesting Provider: Waymond Lorelle Cummins, MD  History of Present Illness   This is a 63 yo man with pmhx a fib not on AC 2/2 low CHADS2-VASc who presented with acute aphasia. No neurologic deficits at baseline. Arrived to work this AM at 0700 and was completely normal per colleagues. Around 0800 a colleague noticed that he was unable to get his words out and had trouble following commands. No motor deficits. Stroke code called upon arrival to ED. CT head normal without ICH prior to TNK on personal review.  Discussed, benefits, and alternatives to TNK were discussed with wife Jonathon Evans who gave informed consent to proceed.  CT angiogram showed a distal left M1 occlusion on personal review and discussion with neuroradiologist.  Wife at bedside. I called Dr. Dolphus our neurointerventionalist on speakerphone and he and I described the mechanical thrombectomy procedure including risks (10% risk of ICH), benefits and alternatives. Wife gave informed consent to proceed which was documented on consent form sent with patient to [facility]. He was transferred to [facility] as a code IR for emergent thrombectomy.   LKW: 0700 Modified rankin score: 0-Completely asymptomatic and back to baseline post- stroke IV Thrombolysis: Yes at 0957 Neurointervention: yes, mechanical thrombectomy for distal L M1 occlusion  NIHSS components Score: Comment  1a Level of Conscious 0[x]  1[]  2[]  3[]      1b LOC Questions 0[]  1[]  2[x]       1c LOC Commands 0[]  1[]  2[x]       2 Best Gaze 0[x]  1[]  2[]       3 Visual 0[]  1[x]  2[]  3[]      4 Facial Palsy 0[x]  1[]  2[]  3[]      5a Motor Arm - left 0[x]  1[]  2[]  3[]  4[]  UN[]    5b Motor Arm - Right 0[x]  1[]  2[]  3[]  4[]  UN[]    6a Motor Leg - Left 0[x]  1[]  2[]  3[]  4[]  UN[]    6b Motor Leg - Right 0[x]  1[]  2[]  3[]  4[]  UN[]    7 Limb Ataxia 0[x]  1[]  2[]   3[]  UN[]     8 Sensory 0[x]  1[]  2[]  UN[]      9 Best Language 0[]  1[]  2[]  3[x]      10 Dysarthria 0[]  1[]  2[x]  UN[]      11 Extinct. and Inattention 0[x]  1[]  2[]       TOTAL:  10      ROS  Unable to ascertain due to aphasia  Past History   Past Medical History:  Diagnosis Date   Arthritis    Right shoulder   GERD (gastroesophageal reflux disease)    Hyperlipidemia    Pre-diabetes    Sleep apnea    CPAP    Past Surgical History:  Procedure Laterality Date   CATARACT EXTRACTION W/PHACO Right 02/12/2019   Procedure: CATARACT EXTRACTION PHACO AND INTRAOCULAR LENS PLACEMENT (IOC) RIGHT 00:44.3            13.1%          5.86;  Surgeon: Myrna Adine Anes, MD;  Location: [facility];  Service: Ophthalmology;  Laterality: Right;  sleep apnea   CATARACT EXTRACTION W/PHACO Left 07/19/2022   Procedure: CATARACT EXTRACTION PHACO AND INTRAOCULAR LENS PLACEMENT (IOC) LEFT  2.50  00:20.0;  Surgeon: Myrna Adine Anes, MD;  Location: [facility];  Service: Ophthalmology;  Laterality: Left;   colonoscopy and upper endoscopy  COLONOSCOPY WITH PROPOFOL  N/A 02/16/2016   Procedure: COLONOSCOPY WITH PROPOFOL ;  Surgeon: Gladis RAYMOND Mariner, MD;  Location: [facility] ENDOSCOPY;  Service: Endoscopy;  Laterality: N/A;    Family History: No family history on file.  Social History  reports that he has never smoked. He has never used smokeless tobacco. He reports that he does not drink alcohol and does not use drugs.  No Known Allergies  Medications  No current facility-administered medications for this encounter.  Current Outpatient Medications:    aspirin EC 81 MG tablet, Take 81 mg by mouth daily., Disp: , Rfl:    ciclopirox (PENLAC) 8 % solution, Apply topically. (Patient not taking: Reported on 03/21/2023), Disp: , Rfl:    diltiazem  (CARDIZEM  CD) 120 MG 24 hr capsule, Take 1 capsule (120 mg total) by mouth daily., Disp: 90 capsule, Rfl: 3   GLUCOSAMINE-FISH OIL-EPA-DHA PO, Take by mouth.,  Disp: , Rfl:    Multiple Vitamin (MULTIVITAMIN) tablet, Take 1 tablet by mouth daily., Disp: , Rfl:    omeprazole  (PRILOSEC) 20 MG capsule, Take 1 capsule (20 mg total) by mouth daily., Disp: 90 capsule, Rfl: 1   rosuvastatin  (CRESTOR ) 10 MG tablet, Take 10 mg by mouth daily., Disp: , Rfl:    sildenafil (VIAGRA) 100 MG tablet, Take 100 mg by mouth daily as needed for erectile dysfunction., Disp: , Rfl:    vitamin B-12 (CYANOCOBALAMIN) 1000 MCG tablet, Take 1,000 mcg by mouth daily., Disp: , Rfl:   Vitals  There were no vitals filed for this visit.  There is no height or weight on file to calculate BMI.  Physical Exam   Constitutional: Appears well-developed and well-nourished.  Psych: Affect appropriate to situation.  Eyes: No scleral injection.  HENT: No OP obstruction.  Head: Normocephalic.  Cardiovascular: Normal rate and regular rhythm.  Respiratory: Effort normal, non-labored breathing.  GI: Soft.  No distension. There is no tenderness.  Skin: WDI.   Neurologic Examination   Neurologic exam MS: alert, unable to answer orientation questions, unable to follow commands Speech: dysarthric, severe expressive and receptive aphasia CN: PERRL, no blink to threat on R, EOMI, sensation intact, face symmetric, hearing intact to voice Motor: no drift in any extremity Sensory: SILT Reflexes: 2+ symm with toes down bilat Coordination: UTA Gait: deferred   Labs/Imaging/Neurodiagnostic studies   CBC: No results for input(s): WBC, NEUTROABS, HGB, HCT, MCV, PLT in the last 168 hours. Basic Metabolic Panel: No results found for: NA, K, CO2, GLUCOSE, BUN, CREATININE, CALCIUM , GFRNONAA, GFRAA Lipid Panel: No results found for: LDLCALC HgbA1c: No results found for: HGBA1C Urine Drug Screen: No results found for: LABOPIA, COCAINSCRNUR, LABBENZ, AMPHETMU, THCU, LABBARB  Alcohol Level No results found for: ETH INR No results found for:  INR APTT No results found for: APTT AED levels: No results found for: PHENYTOIN, ZONISAMIDE, LAMOTRIGINE, LEVETIRACETA  CT Head without contrast(Personally reviewed): Normal, no ICH  CT angio Head and Neck with contrast(Personally reviewed): Distal L M1 occlusion   ASSESSMENT   This is a 63 yo man with pmhx a fib not on AC 2/2 low CHADS2-VASc who presented with acute aphasia since 0700, now s/p TNK. He was found to have distal L M1 occlusion and is being transferred to [facility] for mechanical thrombectomy. Consent obtained by myself and Dr. Dolphus (on speakerphone) with wife Jonathon Evans at bedside.  RECOMMENDATIONS   - Patient en route to [facility] as code IR for mechanical thrombectomy - Vitals, neurochecks per post-TNK protocol - No anticoagulation, antiplatelets, or pharmacologic  DVT prophylaxis until 24 hr head CT shows no ICH - Permissive HTN goal <185/105. IV labetalol  or hydralazine for BP above parameters - Further guidance per IR and admitting neuro teams at [facility]  D/w Dr. Dolphus, Dr. Victorine, Dr. Waymond  ______________________________________________________________________  This patient is critically ill and at significant risk of neurological worsening, death and care requires constant monitoring of vital signs, hemodynamics,respiratory and cardiac monitoring, neurological assessment, discussion with family, other specialists and medical decision making of high complexity. I spent 70 minutes of neurocritical care time  in the care of  this patient. This was time spent independent of any time provided by nurse practitioner or PA.  Elida Ross, MD [facility] [phone]  If 7pm- 7am, please page neurology on call as listed in AMION.     Signed, Elida CHRISTELLA Ross, MD [facility]

## 2023-04-25 NOTE — Sedation Documentation (Signed)
 Patient transported to recovery area via stretcher. Bedside report given to RN. Femoral site assessed - Level 0, no hematoma, dressing is clean, dry, and intact. Pulses also assessed bilaterally.

## 2023-04-25 NOTE — ED Notes (Signed)
 Code  stroke  called  to  carelink  at  9:39 am

## 2023-04-25 NOTE — Procedures (Signed)
 INR.  Status post left common carotid arteriogram.  Right CFA approach.  Findings.  1.  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  Status post complete revascularization of the distal left M1 segment extending into the superior division withx1 pass with an 062 penumbra aspiration  catheter achieving a TICI 3 revascularization.    Manual compression held at the right groin puncture site with quick clot for 25 minutes due to unsuccessful attempt with an 8 French Angio-Seal closure device..    Distal pulses all present bilaterally unchanged from prior to the  procedure.  Post CT brain demonstrates no evidence of hemorrhagic complications.  Extubated Following simple commands appropriately.  Still has word finding difficulties.  Intermittent difficulty with naming objects also.  S.Yeny Schmoll MD

## 2023-04-25 NOTE — Progress Notes (Addendum)
   Subjective: Possible CVA    Patient ID: Jonathon Taheem Fricke., male    DOB: 1961-01-23, 63 y.o.   MRN: [id number]  HPI Patient was driven to this clinic by his work partner.  Partner states he noticed at work that the patient was having difficulty gripping tools.  Also noted he was unable to answer questions appropriately.  Partner states incident manage questions.  States unable to follow commands.  Onset of complaint approximately 1 hour.   Review of Systems A-fib and hypogonadism.    Objective:   Physical Exam BP 157/92BP. 157/92. Data is abnormal. Taken on 04/25/23 9:32 AM  BP Location Left Arm  Patient Position Sitting  Cuff Size Large  Pulse Rate 82  Resp 20  SpO2 96 %  Patient balance impaired Physical exam reveals no facial droop however patient was unable to close eyes on  command. Demonstrated no vision loss Speech was was incoherent.  Patient grip strength was decreased.  Unable to maintain arms in an upright position.  Leg strength was decreased.      Assessment & Plan: CVA  EMS notified and transported patient to [facility] hospital for further evaluation and treatment.

## 2023-04-25 NOTE — Transfer of Care (Signed)
 Immediate Anesthesia Transfer of Care Note  Patient: Jonathon Evans.  Procedure(s) Performed: RADIOLOGY WITH ANESTHESIA  Patient Location: PACU  Anesthesia Type:General  Level of Consciousness: awake, alert , and oriented  Airway & Oxygen Therapy: Patient Spontanous Breathing and Patient connected to nasal cannula oxygen  Post-op Assessment: Report given to RN, Post -op Vital signs reviewed and stable, Patient moving all extremities X 4, and Patient able to stick tongue midline  Post vital signs: Reviewed and stable  Last Vitals:  Vitals Value Taken Time  BP    Temp    Pulse    Resp    SpO2      Last Pain: There were no vitals filed for this visit.       Complications: No notable events documented.

## 2023-04-25 NOTE — Code Documentation (Signed)
 Stroke Response Nurse Documentation Code Documentation  Jonathon Evans. is a 63 y.o. male arriving to [facility] via [facility] on 04/24/2022 with past medical hx of GERD, sleep apnea, pre-diabetes, hyperlipidemia, a-fib. On aspirin 81 mg daily. Code stroke was activated by ED.   Patient from work where he was LKW at 0700 and now complaining of aphasia. A coworker noticed he was having difficulty speaking and was taken to [facility] [employer] [facility]. EMS was called for expressive aphasia. Code stroke called on arrival to ED.   Stroke team at the bedside on patient arrival. Labs drawn and patient cleared for CT by Dr. Jerri. Patient to CT with team. NIHSS 7, see documentation for details and code stroke times. Patient with disoriented, right hemianopia, and Global aphasia  on exam. The following imaging was completed:  CT Head and CTA. Patient is a candidate for IV Thrombolytic. TNK given in CT at 0957. Patient is a candidate for IR. Procedure discussed on the phone with wife, MD Matthews, and Deveshwar.   Care Plan: rapid transfer to [facility] for IR.  post-TNK VS and NIHSS per protocol. Swallow screen per protocol. COVID swab sent prior to departure.   Bedside handoff with [facility] ED RN Ritta FALCON.  Richardson DASEN, [facility] Stroke Response RN given report over the phone by this clinical research associate.   Burnard KANDICE Bras  Stroke Response RN

## 2023-04-25 NOTE — Anesthesia Preprocedure Evaluation (Signed)
 Anesthesia Evaluation  Patient identified by MRN, date of birth, ID band Patient awake    Reviewed: Allergy & Precautions, H&P , NPO status , Patient's Chart, lab work & pertinent test results  Airway Mallampati: IV  TM Distance: >3 FB   Mouth opening: Limited Mouth Opening  Dental no notable dental hx.    Pulmonary neg pulmonary ROS, sleep apnea and Continuous Positive Airway Pressure Ventilation    breath sounds clear to auscultation       Cardiovascular negative cardio ROS  Rhythm:Regular Rate:Normal     Neuro/Psych negative neurological ROS  negative psych ROS   GI/Hepatic Neg liver ROS,GERD  Controlled and Medicated,,  Endo/Other    Class 3 obesityPre-diabetes on semaglutide  Renal/GU negative Renal ROS  negative genitourinary   Musculoskeletal negative musculoskeletal ROS (+) Arthritis , Osteoarthritis,    Abdominal  (+) + obese  Peds negative pediatric ROS (+)  Hematology negative hematology ROS (+)   Anesthesia Other Findings GERD (gastroesophageal reflux disease) Sleep apnea CPAP uses nightly Arthritis  Pre-diabetes    Reproductive/Obstetrics negative OB ROS                             Anesthesia Physical Anesthesia Plan  ASA: 3 and emergent  Anesthesia Plan: General   Post-op Pain Management:    Induction: Intravenous  PONV Risk Score and Plan: 2 and Ondansetron , Midazolam  and Treatment may vary due to age or medical condition  Airway Management Planned: Oral ETT  Additional Equipment:   Intra-op Plan:   Post-operative Plan: Possible Post-op intubation/ventilation  Informed Consent: I have reviewed the patients History and Physical, chart, labs and discussed the procedure including the risks, benefits and alternatives for the proposed anesthesia with the patient or authorized representative who has indicated his/her understanding and acceptance.     Dental  Advisory Given  Plan Discussed with: Anesthesiologist, CRNA and Surgeon  Anesthesia Plan Comments:        Anesthesia Quick Evaluation

## 2023-04-25 NOTE — Progress Notes (Signed)
 Orthopedic Tech Progress Note Patient Details:  Jonathon Evans. [birth year] [id number]  Called floor to see if patient has on knee immobilizer, no RN assigned to patient at this second to send message to.  Patient ID: Jonathon Kenyon., male   DOB: 1960/08/09, 63 y.o.   MRN: [id number]  Jonathon Evans Pac 04/25/2023, 2:57 PM

## 2023-04-25 NOTE — Progress Notes (Signed)
 Orthopedic Tech Progress Note Patient Details:  Jonathon Evans [birth year] [id number]  Patient has on knee immobilizer    Patient ID: Jonathon Disney., male   DOB: July 02, 1960, 63 y.o.   MRN: [id number]  Jonathon Evans 04/25/2023, 5:19 PM

## 2023-04-25 NOTE — Progress Notes (Signed)
 Code stroke cart activated at 0946-Pt in CT and Dr. Selina Cooley at bedside at time of cart activation. Ricci Barker, Multimedia programmer

## 2023-04-25 NOTE — Progress Notes (Signed)
 Referring Physician(s): Dr JAYSON Ross  Supervising Physician: Dolphus Carrion  Patient Status:  [facility] - In-pt  Chief Complaint:  CVA L MCA thrombus  Subjective:  L MCA revascularization in NIR  Near occlusive thrombus in the distal left M1 segment extending into the proximal superior  division of the left MCA. Status post complete revascularization of the distal left M1 segment extending into the superior division withx1 pass with an 062 penumbra aspiration  catheter achieving a TICI 3 revascularization.   Manual compression held at the right groin puncture site with quick clot for 25 minutes due to unsuccessful attempt with an 8 French Angio-Seal closure device..   Pt is doing well Speaking in complete sentences Answers all questions appropriately  Allergies: Patient has no known allergies.  Medications: Prior to Admission medications   Medication Sig Start Date End Date Taking? Authorizing Provider  aspirin EC 81 MG tablet Take 81 mg by mouth daily.    [provider]  ciclopirox (PENLAC) 8 % solution Apply topically. Patient not taking: Reported on 03/21/2023 10/26/22   [provider]  diltiazem  (CARDIZEM  CD) 120 MG 24 hr capsule Take 1 capsule (120 mg total) by mouth daily. 03/21/23 06/19/23  Darliss Rogue, MD  GLUCOSAMINE-FISH OIL-EPA-DHA PO Take by mouth.    [provider]  Multiple Vitamin (MULTIVITAMIN) tablet Take 1 tablet by mouth daily.    [provider]  omeprazole  (PRILOSEC) 20 MG capsule Take 1 capsule (20 mg total) by mouth daily. 12/07/18   Jama Mitzie ORN, PA-C  rosuvastatin  (CRESTOR ) 10 MG tablet Take 10 mg by mouth daily.    [provider]  sildenafil (VIAGRA) 100 MG tablet Take 100 mg by mouth daily as needed for erectile dysfunction.    [provider]  vitamin B-12 (CYANOCOBALAMIN) 1000 MCG tablet Take 1,000 mcg by mouth daily.    [provider]     Vital Signs: BP (!) 167/98    Pulse 83   Temp 98.2 F (36.8 C) (Oral)   Resp (!) 26   Wt (!) 335 lb (152 kg) Comment: Patient stated 335 pounds  SpO2 90%   BMI 44.20 kg/m   Physical Exam Vitals reviewed.  Constitutional:      Comments: A/O Appropriate Face symmetrical Smile =   HENT:     Mouth/Throat:     Mouth: Mucous membranes are moist.  Abdominal:     Palpations: Abdomen is soft.     Tenderness: There is no abdominal tenderness.  Musculoskeletal:        General: Normal range of motion.     Comments: Moving all 4s = strength =sensation  Rt groin NT No hematoma Rt foot good puls  Skin:    General: Skin is warm.  Neurological:     Mental Status: He is alert and oriented to person, place, and time.  Psychiatric:        Behavior: Behavior normal.     Imaging: CT ANGIO HEAD NECK W WO CM (CODE STROKE) Result Date: 04/25/2023 CLINICAL DATA:  Provided history: Expressive aphasia. Last known well 7 a.m. EXAM: CT ANGIOGRAPHY HEAD AND NECK WITH AND WITHOUT CONTRAST TECHNIQUE: Multidetector CT imaging of the head and neck was performed using the standard protocol during bolus administration of intravenous contrast. Multiplanar CT image reconstructions and MIPs were obtained to evaluate the vascular anatomy. Carotid stenosis measurements (when applicable) are obtained utilizing NASCET criteria, using the distal internal carotid diameter as the denominator. RADIATION DOSE REDUCTION: This  exam was performed according to the departmental dose-optimization program which includes automated exposure control, adjustment of the mA and/or kV according to patient size and/or use of iterative reconstruction technique. CONTRAST:  75mL OMNIPAQUE  IOHEXOL  350 MG/ML SOLN COMPARISON:  Non-contrast head CT performed earlier today 04/25/2023. FINDINGS: CTA NECK FINDINGS Aortic arch: Standard aortic branching. Atherosclerotic plaque within the proximal major branch vessels of the neck. No hemodynamically significant innominate or  proximal subclavian artery stenosis. Right carotid system: CCA and ICA patent within the neck without stenosis or significant atherosclerotic disease Left carotid system: CCA and ICA patent within the neck without stenosis. Nonstenotic calcified plaque at the CCA origin and within the proximal ICA. Vertebral arteries: Patent within the neck without stenosis or significant atherosclerotic disease. The right vertebral artery is slightly dominant. Skeleton: Nonspecific reversal of the expected cervical lordosis. Spondylosis at the cervical and visualized upper thoracic levels. No acute fracture or aggressive osseous lesion. Other neck: No neck mass or cervical lymphadenopathy. Upper chest: No consolidation within the imaged lung apices. Review of the MIP images confirms the above findings CTA HEAD FINDINGS Anterior circulation: The intracranial internal carotid arteries are patent. Nonstenotic calcified plaque within both vessels. The left middle cerebral artery M1 segment is occluded at its distal aspect. Occlusive thrombus extends into at least one adjacent proximal M2 left MCA vessel. The right middle cerebral artery M1 segment is patent. No right M2 proximal branch occlusion or high-grade proximal stenosis. The anterior cerebral arteries are patent. Posterior circulation: The intracranial vertebral arteries are patent. The basilar artery is patent. The posterior cerebral arteries are patent. Posterior communicating arteries are diminutive or absent, bilaterally. Venous sinuses: Within the limitations of contrast timing, no convincing thrombus. Anatomic variants: As described. Review of the MIP images confirms the above findings CTA head impression called by telephone at the time of interpretation on 04/25/2023 at 10:10 am to provider [facility] , who verbally acknowledged these results. IMPRESSION: CTA neck: The common carotid, internal carotid and vertebral arteries are patent within the neck without stenosis.  Non-stenotic atherosclerotic plaque at the left common carotid artery origin and within the proximal left internal carotid artery. CTA head: The left middle cerebral artery M1 segment is occluded at its distal aspect. Occlusive thrombus extends into at least one adjacent proximal M2 left middle cerebral artery vessel. Electronically Signed   By: Rockey Childs D.O.   On: 04/25/2023 10:24   CT HEAD CODE STROKE WO CONTRAST Result Date: 04/25/2023 CLINICAL DATA:  Code stroke. Provided history: Neuro deficit, acute, stroke suspected. Expressive aphasia. EXAM: CT HEAD WITHOUT CONTRAST TECHNIQUE: Contiguous axial images were obtained from the base of the skull through the vertex without intravenous contrast. RADIATION DOSE REDUCTION: This exam was performed according to the departmental dose-optimization program which includes automated exposure control, adjustment of the mA and/or kV according to patient size and/or use of iterative reconstruction technique. COMPARISON:  None. FINDINGS: Brain: There is no acute intracranial hemorrhage. No demarcated cortical infarct. No extra-axial fluid collection. No evidence of an intracranial mass. No midline shift. Vascular: No hyperdense vessel. Skull: No calvarial fracture or aggressive osseous lesion. Sinuses/Orbits: No mass or acute finding within the imaged orbits. Minimal mucosal thickening within the left frontal, bilateral ethmoid and bilateral maxillary sinuses at the imaged levels. ASPECTS [facility] Stroke Program Early CT Score) - Ganglionic level infarction (caudate, lentiform nuclei, internal capsule, insula, M1-M3 cortex): 7 - Supraganglionic infarction (M4-M6 cortex): 3 Total score (0-10 with 10 being normal): 10 No evidence of an  acute intracranial abnormality on the non-contrast head CT. These results were communicated to Dr. Matthews at 10:09 amon 1/13/2025by text page via the [facility] messaging system. IMPRESSION: No evidence of an acute intracranial abnormality.   ASPECTS is 10. Electronically Signed   By: Rockey Childs D.O.   On: 04/25/2023 10:13    Labs:  CBC: Recent Labs    04/25/23 0943  WBC 6.6  HGB 16.1  HCT 48.2  PLT 187    COAGS: Recent Labs    04/25/23 0943  INR 1.0  APTT 25    BMP: Recent Labs    04/25/23 0943  NA 140  K 4.3  CL 103  CO2 24  GLUCOSE 126*  BUN 18  CALCIUM  9.2  CREATININE 1.22  GFRNONAA >60    LIVER FUNCTION TESTS: Recent Labs    04/25/23 0943  BILITOT 1.2  AST 25  ALT 17  ALKPHOS 55  PROT 7.6  ALBUMIN 4.2    Assessment and Plan:  L MCA revascularization in NIR Doing well Call if need NIR  Electronically Signed: Sharlet DELENA Candle, PA-C 04/25/2023, 3:33 PM   I spent a total of 15 Minutes at the the patient's bedside AND on the patient's hospital floor or unit, greater than 50% of which was counseling/coordinating care for L MCA revascularization

## 2023-04-25 NOTE — Anesthesia Postprocedure Evaluation (Signed)
 Anesthesia Post Note  Patient: Jonathon Evans.  Procedure(s) Performed: RADIOLOGY WITH ANESTHESIA     Patient location during evaluation: PACU Anesthesia Type: General Level of consciousness: awake and alert Pain management: pain level controlled Vital Signs Assessment: post-procedure vital signs reviewed and stable Respiratory status: spontaneous breathing, nonlabored ventilation and respiratory function stable Cardiovascular status: blood pressure returned to baseline and stable Postop Assessment: no apparent nausea or vomiting Anesthetic complications: no   No notable events documented.  Last Vitals:  Vitals:   04/25/23 1400 04/25/23 1415  BP: (!) 161/87 (!) 148/74  Pulse: 69 74  Resp: 17 17  Temp:    SpO2: 97% 90%    Last Pain:  Vitals:   04/25/23 1415  PainSc: 0-No pain                 Butler Levander Pinal

## 2023-04-25 NOTE — ED Provider Notes (Signed)
 Jonathon Evans [facility] Provider Note    Event Date/Time   First MD Initiated Contact with Patient 04/25/23 [phone]     (approximate)   History   Code Stroke   HPI  [name] Hickle. is a 63 y.o. male hyperlipidemia, hypertension, A-fib on aspirin and Cardizem  presenting with expressive aphasia.  Last normal was at 7 AM.  He showed up to work normal at 7 AM.  No trauma or falls.  No reported alcohol use.  Per EMS blood glucose was 120s.  No motor or sensory deficits.  No slurred speech but is unable to answer questions appropriately.  Last blood pressure they had was 190s over 130s.  No medications were given.   On independent review he was seen by his cardiologist in December, history of persistent A-fib and asymptomatic.  CHA2DS2-VASc or of 1 and not deemed a candidate for invasive procedure or NOAC at that time.  Physical Exam   Triage Vital Signs: ED Triage Vitals [04/25/23 1000]  Encounter Vitals Group     BP (!) 159/78     Systolic BP Percentile      Diastolic BP Percentile      Pulse Rate 80     Resp 20     Temp      Temp src      SpO2 96 %     Weight      Height      Head Circumference      Peak Flow      Pain Score      Pain Loc      Pain Education      Exclude from Growth Chart     Most recent vital signs: Vitals:   04/25/23 1015 04/25/23 1023  BP: (!) 155/88   Pulse: 62   Resp: (!) 21   Temp:  99.1 F (37.3 C)  SpO2: 99%     General: Awake, no distress.  CV:  Good peripheral perfusion.  Resp:  Normal effort.  Abd:  No distention.  Other:  No obvious facial droop, no focal weakness or numbness.  He is able to follow commands.  No slurred speech, unable to answer questions appropriately, says I do not know to most questions asked.   ED Results / Procedures / Treatments   Labs (all labs ordered are listed, but only abnormal results are displayed) Labs Reviewed  COMPREHENSIVE METABOLIC PANEL - Abnormal; Notable for the following  components:      Result Value   Glucose, Bld 126 (*)    All other components within normal limits  RESP PANEL BY RT-PCR (RSV, FLU A&B, COVID)  RVPGX2  ETHANOL  PROTIME-INR  APTT  CBC WITH DIFFERENTIAL/PLATELET  URINE DRUG SCREEN, QUALITATIVE ([facility] ONLY)     EKG  .EKG: atrial fibrillation, rate 77, atrial fibrillation, right bundle branch block, no ischemic ST elevation,.  Not significant change compared to prior    RADIOLOGY On my independent review and interpretation, no obvious intracranial hemorrhage.  Radiology agreed and impression no acute intracranial hemorrhage.    PROCEDURES:  Critical Care performed: Yes, see critical care procedure note(s)  .Critical Care  Performed by: Waymond Lorelle Cummins, MD Authorized by: Waymond Lorelle Cummins, MD   Critical care provider statement:    Critical care time (minutes):  30   Critical care was necessary to treat or prevent imminent or life-threatening deterioration of the following conditions:  CNS failure or compromise   Critical care was  time spent personally by me on the following activities:  Development of treatment plan with patient or surrogate, discussions with consultants, evaluation of patient's response to treatment, examination of patient, ordering and review of laboratory studies, ordering and review of radiographic studies, ordering and performing treatments and interventions, pulse oximetry, re-evaluation of patient's condition and review of old charts .1-3 Lead EKG Interpretation  Performed by: Waymond Lorelle Cummins, MD Authorized by: Waymond Lorelle Cummins, MD     Interpretation: abnormal     ECG rate assessment: normal     Rhythm: atrial fibrillation      MEDICATIONS ORDERED IN ED: Medications  tenecteplase  (TNKASE ) injection for Stroke 0.25 mg/kg (25 mg Intravenous Given 04/25/23 0957)  iohexol  (OMNIPAQUE ) 350 MG/ML injection 75 mL (75 mLs Intravenous Contrast Given 04/25/23 1000)     IMPRESSION / MDM / ASSESSMENT AND PLAN / ED COURSE  I  reviewed the triage vital signs and the nursing notes.                              Differential diagnosis includes, but is not limited to, CVA, TIA, ventricular hemorrhage, mass, toxidrome, electrolyte derangements.   Patient's presentation is most consistent with acute presentation with potential threat to life or bodily function.  Patient presenting with acute onset expressive aphasia concerning for acute CVA.  Dr. Matthews from neurology is at bedside, consulted with her and she feels that this is a significant stroke requiring TNK.  She had a discussion with patient's wife and consented him for TNK.  CTA was also ordered, he has a large LVO which warrants transfer to higher level of care.   Independent review and interpretation of labs, platelets are normal no leukocytosis, hematocrit is normal, glucose is 126, electrolytes no severely deranged, creatinine is 1.2.  Creatinine is at baseline compared to prior.  Ethanol level is less than 10.   Jonathon Evans  FINAL CLINICAL IMPRESSION(S) / ED DIAGNOSES   Final diagnoses:  Acute ischemic stroke (HCC)     Rx / DC Orders   ED Discharge Orders     None        Note:  This document was prepared using Dragon voice recognition software and may include unintentional dictation errors.    Waymond Lorelle Cummins, MD 04/25/23 [phone]

## 2023-04-25 NOTE — H&P (Signed)
 NEUROLOGY H&P NOTE   Date of service: April 25, 2023 Patient Name: Jonathon Evans. MRN:  [id number] DOB:  Apr 26, 1960 Chief Complaint: Acute onset aphasia  History of Present Illness  Elena Gino Garrabrant. is a 63 y.o. male with past medical history significant for A-fib not on anticoagulation, CHADS2-VASc who presented to [facility] after onset of aphasia at work this morning. He was unable to get his words out and had trouble following commands. CODE STROKE was called upon arrival to [place] ED. CTH negative for ICH. Risks, benefits, and alternatives to TNK were discussed by Dr. Matthews neurologist with wife Eve who gave informed consent to proceed. Given at 0957.  CTA showed distal left M1 occlusion. Risks, benefits, alternatives for emergent thrombectomy were discussed between, Dr. Matthews, Dr. Dolphus and wife, Annabelle, who gave informed consent over phone. He was then transferred to [facility] for procedure.  On arrival to [facility], patient alert and oriented with disorientation only to month, exhibiting expressive aphasia, no focal weakness, no vision deficits. Consent form reviewed. Patient taken emergently to IR. Hand-offs completed.   Last known well: 0700 Modified rankin score: 0-Completely asymptomatic and back to baseline post- stroke IV Thrombolysis: Yes @ 0957 [facility]) Thrombectomy: Yes, transferred to [facility] for left M1 occlusion  NIHSS components Score: Comment  1a Level of Conscious 0[x]  1[]  2[]  3[]      1b LOC Questions 0[]  1[x]  2[]       1c LOC Commands 0[x]  1[]  2[]       2 Best Gaze 0[x]  1[]  2[]       3 Visual 0[x]  1[]  2[]  3[]      4 Facial Palsy 0[x]  1[]  2[]  3[]      5a Motor Arm - left 0[x]  1[]  2[]  3[]  4[]  UN[]    5b Motor Arm - Right 0[x]  1[]  2[]  3[]  4[]  UN[]    6a Motor Leg - Left 0[x]  1[]  2[]  3[]  4[]  UN[]    6b Motor Leg - Right 0[x]  1[]  2[]  3[]  4[]  UN[]    7 Limb Ataxia 0[x]  1[]  2[]  3[]  UN[]     8 Sensory 0[x]  1[]  2[]  UN[]      9 Best Language 0[]  1[x]  2[]  3[]      10 Dysarthria 0[x]  1[]  2[]  UN[]      11  Extinct. and Inattention 0[x]  1[]  2[]       TOTAL:   2      ROS  Comprehensive ROS performed and pertinent positives documented in the HPI  Past History   Past Medical History:  Diagnosis Date   Arthritis    Right shoulder   GERD (gastroesophageal reflux disease)    Hyperlipidemia    Pre-diabetes    Sleep apnea    CPAP   Past Surgical History:  Procedure Laterality Date   CATARACT EXTRACTION W/PHACO Right 02/12/2019   Procedure: CATARACT EXTRACTION PHACO AND INTRAOCULAR LENS PLACEMENT (IOC) RIGHT 00:44.3            13.1%          5.86;  Surgeon: Myrna Adine Anes, MD;  Location: [facility];  Service: Ophthalmology;  Laterality: Right;  sleep apnea   CATARACT EXTRACTION W/PHACO Left 07/19/2022   Procedure: CATARACT EXTRACTION PHACO AND INTRAOCULAR LENS PLACEMENT (IOC) LEFT  2.50  00:20.0;  Surgeon: Myrna Adine Anes, MD;  Location: [facility];  Service: Ophthalmology;  Laterality: Left;   colonoscopy and upper endoscopy     COLONOSCOPY WITH PROPOFOL  N/A 02/16/2016   Procedure: COLONOSCOPY WITH PROPOFOL ;  Surgeon: Gladis RAYMOND Mariner, MD;  Location:  [facility];  Service: Endoscopy;  Laterality: N/A;   No family history on file. Social History   Socioeconomic History   Marital status: Married    Spouse name: Not on file   Number of children: Not on file   Years of education: Not on file   Highest education level: Not on file  Occupational History   Not on file  Tobacco Use   Smoking status: Never   Smokeless tobacco: Never  Vaping Use   Vaping status: Never Used  Substance and Sexual Activity   Alcohol use: No    Comment: rare - Holidays   Drug use: No   Sexual activity: Not on file  Other Topics Concern   Not on file  Social History Narrative   Not on file   Social Drivers of Health   Financial Resource Strain: Patient Declined (03/24/2023)   Received from [facility]   Overall Financial Resource Strain (CARDIA)     Difficulty of Paying Living Expenses: Patient declined  Food Insecurity: Patient Declined (03/24/2023)   Received from [facility]   Hunger Vital Sign    Worried About Running Out of Food in the Last Year: Patient declined    Ran Out of Food in the Last Year: Patient declined  Transportation Needs: No Transportation Needs (03/24/2023)   Received from [facility] - Transportation    In the past 12 months, has lack of transportation kept you from medical appointments or from getting medications?: No    Lack of Transportation (Non-Medical): No  Physical Activity: Not on file  Stress: Not on file  Social Connections: Not on file   No Known Allergies  Medications   Current Facility-Administered Medications:    [START ON 04/26/2023]  stroke: early stages of recovery book, , Does not apply, Once, Judithe, Erin C, NP   0.9 %  sodium chloride  infusion, , Intravenous, Continuous, Lehner, Erin C, NP   acetaminophen  (TYLENOL ) tablet 650 mg, 650 mg, Oral, Q4H PRN **OR** acetaminophen  (TYLENOL ) 160 MG/5ML solution 650 mg, 650 mg, Per Tube, Q4H PRN **OR** acetaminophen  (TYLENOL ) suppository 650 mg, 650 mg, Rectal, Q4H PRN, Lehner, Erin C, NP   labetalol  (NORMODYNE ) injection 20 mg, 20 mg, Intravenous, Once **AND** clevidipine  (CLEVIPREX ) infusion 0.5 mg/mL, 0-21 mg/hr, Intravenous, Continuous, Lehner, Erin C, NP   pantoprazole  (PROTONIX ) injection 40 mg, 40 mg, Intravenous, QHS, Lehner, Erin C, NP   senna-docusate (Senokot-S) tablet 1 tablet, 1 tablet, Oral, QHS PRN, Lehner, Erin C, NP  Current Outpatient Medications:    aspirin EC 81 MG tablet, Take 81 mg by mouth daily., Disp: , Rfl:    ciclopirox (PENLAC) 8 % solution, Apply topically. (Patient not taking: Reported on 03/21/2023), Disp: , Rfl:    diltiazem  (CARDIZEM  CD) 120 MG 24 hr capsule, Take 1 capsule (120 mg total) by mouth daily., Disp: 90 capsule, Rfl: 3   GLUCOSAMINE-FISH OIL-EPA-DHA PO, Take by  mouth., Disp: , Rfl:    Multiple Vitamin (MULTIVITAMIN) tablet, Take 1 tablet by mouth daily., Disp: , Rfl:    omeprazole  (PRILOSEC) 20 MG capsule, Take 1 capsule (20 mg total) by mouth daily., Disp: 90 capsule, Rfl: 1   rosuvastatin  (CRESTOR ) 10 MG tablet, Take 10 mg by mouth daily., Disp: , Rfl:    sildenafil (VIAGRA) 100 MG tablet, Take 100 mg by mouth daily as needed for erectile dysfunction., Disp: , Rfl:    vitamin B-12 (CYANOCOBALAMIN) 1000 MCG tablet, Take 1,000 mcg  by mouth daily., Disp: , Rfl:    Vitals   There were no vitals filed for this visit.   There is no height or weight on file to calculate BMI.  Physical Exam   Constitutional: Appears well-developed and well-nourished. Psych: Calm, cooperative. Affect appropriate to situation.  Eyes: No scleral injection.  HENT: No OP obstruction.  Head: Normocephalic.  Cardiovascular: Normal rate and regular rhythm.  Respiratory: Effort normal, non-labored breathing.  GI: Soft.  Obese. No distension. There is no tenderness.  Skin: WDI.   Neurologic Examination   Neuro: Mental Status: Patient is awake, alert, oriented to person, place, month, year, and situation. Expressive aphasia present patient aware of deficit and usually able to say I can't say what it is. No dysarthria present.  Follows commands. Cranial Nerves: II: Visual Fields are full. Pupils are equal, round, and reactive to light.   III,IV, VI: EOMI without ptosis or diploplia.  V: Facial sensation is symmetric to temperature VII: Facial movement is symmetric.  VIII: hearing is intact to voice X: Uvula elevates symmetrically, no hoarseness.  XI: Shoulder shrug is symmetric. XII: tongue is midline without atrophy or fasciculations.  Motor: Tone is normal. Bulk is normal.  5/5 strength was present in all four extremities. No drift present.  Sensory: Sensation is symmetric to light touch in the arms and legs. Cerebellar: FNF intact bilaterally   Labs    CBC:  Recent Labs  Lab 04/25/23 0943  WBC 6.6  NEUTROABS 4.0  HGB 16.1  HCT 48.2  MCV 84.4  PLT 187   Basic Metabolic Panel:  Lab Results  Component Value Date   NA 140 04/25/2023   K 4.3 04/25/2023   CO2 24 04/25/2023   GLUCOSE 126 (H) 04/25/2023   BUN 18 04/25/2023   CREATININE 1.22 04/25/2023   CALCIUM  9.2 04/25/2023   GFRNONAA >60 04/25/2023   Lipid Panel: No results found for: LDLCALC HgbA1c: No results found for: HGBA1C Urine Drug Screen: No results found for: LABOPIA, COCAINSCRNUR, LABBENZ, AMPHETMU, THCU, LABBARB  Alcohol Level     Component Value Date/Time   ETH <10 04/25/2023 0943   INR  Lab Results  Component Value Date   INR 1.0 04/25/2023   APTT  Lab Results  Component Value Date   APTT 25 04/25/2023    CT Head without contrast(Personally reviewed): - No acute abnormality - ASPECTS 10  CT angio Head and Neck with contrast(Personally reviewed): - Distal Left M1 occlusion - Occlusive thrombus extends into adjacent proximal L M2.   MRI Brain(Personally reviewed): - will be completed 24hours pot-TNK, ordered for 1/14 0930  Assessment   Quantay Kemoni Ortega. is a 63 y.o. male with past medical history significant for A-fib not on anticoagulation, CHADS2-VASc who presented to [facility] after onset of aphasia at work this morning. He was unable to get his words out and had trouble following commands. CODE STROKE was called upon arrival to [facility] ED. CTH negative for ICH. Risks, benefits, and alternatives to TNK were discussed by Dr. Matthews neurologist with wife Eve who gave informed consent to proceed. Given at 0957.  CTA showed distal left M1 occlusion. Risks, benefits, alternatives for emergent thrombectomy were discussed between, Dr. Matthews, Dr. Dolphus and wife, Annabelle, who gave informed consent over phone. He was then transferred to [facility] for procedure. On arrival to [facility], patient continued to have expressive aphasia, no dysarthria, no focal  weakness, no vision deficits.   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   Secondary Diagnosis: HLD, Pre-Diabetes  Recommendations  - Frequent Neuro checks per stroke unit protocol - MRI Brain stroke protocol, 24hours post-TNK - TTE - Lipid panel - Statin - will be started if LDL>70 or otherwise medically indicated - A1C - Antithrombotic - hold until 24hours post-TNK imaging is completed and stable, negative for bleed.  Orders PER IR - DVT ppx - SCDs until 24hours post-TNK imaging is complete and stable, negative for bleed.  - SBP goal - 120-160 for 1st 24 hours, then <180 due to IR procedure.   PRN labetalol  if HR>60 and PRN Hydralazine if HR<60.  Cleviprex  gtt if these interventions do not allieve hypertension.  - Telemetry monitoring for arrhythmia - 72h - Swallow screen - will be performed prior to PO intake - Stroke education - will be given - PT/OT/SLP - Dispo: ICU  ______________________________________________________________________   Pt seen by Neuro NP/APP and later by MD. Note/plan to be edited by MD as needed.    Rocky JAYSON Likes, DNP, AGACNP-BC [facility] Please use AMION for contact information & EPIC for messaging.  I have seen the patient and was present for the evaluation and management reflected in the above note.  He presented with significant aphasia received IV TNK at [facility].  On arrival to [facility], he had a persistent severe aphasia and in the setting of his M1 occlusion, without benefit after TNK, he was taken for thrombectomy as have been discussed with him and his wife at [facility].  Workup as above, stroke team to follow.  This patient is critically ill and at significant risk of neurological worsening, death and care requires constant monitoring of vital signs, hemodynamics,respiratory and cardiac monitoring, neurological assessment, discussion with family, other specialists and  medical decision making of high complexity. I spent 35 minutes of neurocritical care time  in the care of  this patient. This was time spent independent of any time provided by nurse practitioner or PA.  Aisha Seals, MD [facility] [phone]  If 7pm- 7am, please page neurology on call as listed in AMION. 04/26/2023  7:42 AM

## 2023-04-25 NOTE — Progress Notes (Addendum)
 Brought to [facility] by Evans-worker.  States Jonathon Evans drove himself to work this morning - reports at 7:00 am.  They went to do a job at [employer] Jonathon Evans was having difficulty gripping tools. Speech affected.  Unable to answer questions appropriately.  Tries to answer but answer doesn't match the question. Unable to follow commands.  911 called  Set of Vital Signs obtained  [name] notified what's going on & will meet Jonathon Evans at [facility], PA-C came out to Jonathon Evans.  Fire Dept 1st responders arrived at clinic.  Report given & they assessed Jonathon Evans.  2nd set of vital signs obtained.  [facility]athon Evans [facility] arrived.  Report given.  Assisted to transport gurney.  EMS taking Jonathon Evans to [facility]

## 2023-04-25 NOTE — Progress Notes (Signed)
 Echocardiogram 2D Echocardiogram has been performed.  Lucendia Herrlich 04/25/2023, 5:23 PM

## 2023-04-25 NOTE — Progress Notes (Signed)
   04/25/23 0947  Spiritual Encounters  Type of Visit Initial  Care provided to: Family  Reason for visit Code  OnCall Visit No  Spiritual Framework  Presenting Themes Courage hope and growth (Family requested prayer)  Community/Connection Family (Wife and children at bedside)  Family Stress Factors Other (Comment) (Wife concerned husbands health)  Interventions  Spiritual Care Interventions Made Prayer;Established relationship of care and support;Compassionate presence;Reflective listening;Encouragement  Intervention Outcomes  Outcomes Connection to spiritual care;Reduced anxiety;Reduced fear;Awareness of health;Awareness of support   Wife and children were at bedside; gave prayer and encouragement.

## 2023-04-25 NOTE — Progress Notes (Signed)
 CODE STROKE- PHARMACY COMMUNICATION   Time CODE STROKE called/page received: 0946  Time response to CODE STROKE was made (in person or via phone): Immediately  Time Stroke Kit retrieved from Pyxis (only if needed): Immediately, TNK administered 0957  Name of Provider/Nurse contacted: Dr. Matthews  Past Medical History:  Diagnosis Date   Arthritis    Right shoulder   GERD (gastroesophageal reflux disease)    Hyperlipidemia    Pre-diabetes    Sleep apnea    CPAP   Prior to Admission medications   Medication Sig Start Date End Date Taking? Authorizing Provider  aspirin EC 81 MG tablet Take 81 mg by mouth daily.    [provider]  ciclopirox (PENLAC) 8 % solution Apply topically. Patient not taking: Reported on 03/21/2023 10/26/22   [provider]  diltiazem  (CARDIZEM  CD) 120 MG 24 hr capsule Take 1 capsule (120 mg total) by mouth daily. 03/21/23 06/19/23  Darliss Rogue, MD  GLUCOSAMINE-FISH OIL-EPA-DHA PO Take by mouth.    [provider]  Multiple Vitamin (MULTIVITAMIN) tablet Take 1 tablet by mouth daily.    [provider]  omeprazole  (PRILOSEC) 20 MG capsule Take 1 capsule (20 mg total) by mouth daily. 12/07/18   Jama Mitzie ORN, PA-C  rosuvastatin  (CRESTOR ) 10 MG tablet Take 10 mg by mouth daily.    [provider]  sildenafil (VIAGRA) 100 MG tablet Take 100 mg by mouth daily as needed for erectile dysfunction.    [provider]  vitamin B-12 (CYANOCOBALAMIN) 1000 MCG tablet Take 1,000 mcg by mouth daily.    [provider]   Marolyn KATHEE Mare 04/25/2023  11:37 AM

## 2023-04-25 NOTE — ED Triage Notes (Signed)
 Pt to ED via [facility] from [facility] for expressive asphasia. Pt has hx/o A. Fib, pt is not on blood thinner. EMS reports that per co-workers, pts LWK is 0700.   On arrival to ED pt has severe expressive asphasia. No facial droop noted. CODE STROKE called by this RN at [phone]

## 2023-04-26 ENCOUNTER — Inpatient Hospital Stay (HOSPITAL_COMMUNITY): Payer: 59

## 2023-04-26 ENCOUNTER — Other Ambulatory Visit (HOSPITAL_COMMUNITY): Payer: Self-pay

## 2023-04-26 ENCOUNTER — Telehealth (HOSPITAL_COMMUNITY): Payer: Self-pay | Admitting: Pharmacy Technician

## 2023-04-26 DIAGNOSIS — I639 Cerebral infarction, unspecified: Secondary | ICD-10-CM | POA: Diagnosis not present

## 2023-04-26 LAB — LIPID PANEL
Cholesterol: 150 mg/dL (ref 0–200)
HDL: 55 mg/dL (ref >40)
LDL Cholesterol: 60 mg/dL (ref 0–99)
Total CHOL/HDL Ratio: 2.7 {ratio}
Triglycerides: 177 mg/dL — ABNORMAL HIGH (ref <150)
VLDL: 35 mg/dL (ref 0–40)

## 2023-04-26 MED ORDER — APIXABAN 5 MG PO TABS
5.0000 mg | ORAL_TABLET | Freq: Two times a day (BID) | ORAL | Status: DC
  Administered 2023-04-26 – 2023-04-27 (×3): 5 mg via ORAL
  Filled 2023-04-26 (×3): qty 1

## 2023-04-26 MED ORDER — DILTIAZEM HCL ER COATED BEADS 120 MG PO CP24
120.0000 mg | ORAL_CAPSULE | Freq: Every day | ORAL | Status: DC
  Administered 2023-04-26 – 2023-04-27 (×2): 120 mg via ORAL
  Filled 2023-04-26 (×2): qty 1

## 2023-04-26 MED ORDER — ROSUVASTATIN CALCIUM 5 MG PO TABS
10.0000 mg | ORAL_TABLET | Freq: Every day | ORAL | Status: DC
  Administered 2023-04-26 – 2023-04-27 (×2): 10 mg via ORAL
  Filled 2023-04-26 (×2): qty 2

## 2023-04-26 MED ORDER — PANTOPRAZOLE SODIUM 40 MG PO TBEC
40.0000 mg | DELAYED_RELEASE_TABLET | Freq: Every day | ORAL | Status: DC
  Administered 2023-04-26: 40 mg via ORAL
  Filled 2023-04-26: qty 1

## 2023-04-26 NOTE — Evaluation (Addendum)
 Occupational Therapy Evaluation Patient Details Name: Jonathon Evans. MRN: [id number] DOB: 05-Jul-1960 Today's Date: 04/26/2023   History of Present Illness 63 yo male presented to [facility] with aphasia given TNK. Transfe rto [facility] with L M1 occlusion.  s/p L MCA revascularization MRI (+) Punctate acute infarct in the anterior/inferior aspect of the left external capsule. PMH Afib CHADS2-VASc, R shoulder arthritis, Sleep apnea with CPAP   Clinical Impression   Patient evaluated by Occupational Therapy with no further acute OT needs identified. All education has been completed and the patient has no further questions. See below for any follow-up Occupational Therapy or equipment needs. OT to sign off. Thank you for referral.         If plan is discharge home, recommend the following:      Functional Status Assessment  Patient has had a recent decline in their functional status and demonstrates the ability to make significant improvements in function in a reasonable and predictable amount of time.  Equipment Recommendations  None recommended by OT    Recommendations for Other Services       Precautions / Restrictions Precautions Precautions: None      Mobility Bed Mobility Overal bed mobility: Modified Independent                  Transfers Overall transfer level: Modified independent                        Balance                                           ADL either performed or assessed with clinical judgement   ADL Overall ADL's : Needs assistance/impaired Eating/Feeding: Independent   Grooming: Wash/dry hands   Upper Body Bathing: Independent   Lower Body Bathing: Minimal assistance   Upper Body Dressing : Independent   Lower Body Dressing: Minimal assistance Lower Body Dressing Details (indicate cue type and reason): pt at baseline must stand and struggle to get on socks. educated on sock aide to help with LB dressing. pt shown  video for education Toilet Transfer: Independent           Functional mobility during ADLs: Independent General ADL Comments: pt completed 6 steps with rail     Vision Baseline Vision/History: 1 Wears glasses Ability to See in Adequate Light: 1 Impaired Patient Visual Report: No change from baseline Additional Comments: baseline suppose to wear glasses to read and to drive     Perception         Praxis         Pertinent Vitals/Pain Pain Assessment Pain Assessment: No/denies pain     Extremity/Trunk Assessment Upper Extremity Assessment Upper Extremity Assessment: Overall WFL for tasks assessed   Lower Extremity Assessment Lower Extremity Assessment: Overall WFL for tasks assessed   Cervical / Trunk Assessment Cervical / Trunk Assessment: Normal   Communication Communication Communication: No apparent difficulties   Cognition Arousal: Alert Behavior During Therapy: WFL for tasks assessed/performed Overall Cognitive Status: Within Functional Limits for tasks assessed                                       General Comments  VSS    Exercises Exercises: Other exercises Other Exercises Other  Exercises: educated on signs symptoms of stroke using booklet. advised on reducing sodium in food to manage BP   Shoulder Instructions      Home Living Family/patient expects to be discharged to:: Private residence Living Arrangements: Spouse/significant other;Children;Non-relatives/Friends Available Help at Discharge: Family Type of Home: House Home Access: Stairs to enter [employer] of Steps: 5 (garage) and 8 onto the porch Entrance Stairs-Rails: Left Home Layout: Two level;Able to live on main level with bedroom/bathroom;Full bath on main level     Bathroom Shower/Tub: Producer, Television/film/video: Standard     Home Equipment: None   Additional Comments: Diplomatic Services Operational Officer, 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 / 2 grandchildren  Lives With:  Spouse    Prior Functioning/Environment Prior Level of Function : Independent/Modified Independent;Working/employed;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 ))                        OT Problem List:        OT Treatment/Interventions:      OT Goals(Current goals can be found in the care plan section) Acute Rehab OT Goals Patient Stated Goal: i feel like i am normal Potential to Achieve Goals: Good  OT Frequency:      Co-evaluation              AM-PAC OT 6 Clicks Daily Activity     Outcome Measure Help from another person eating meals?: None Help from another person taking care of personal grooming?: None Help from another person toileting, which includes using toliet, bedpan, or urinal?: None Help from another person bathing (including washing, rinsing, drying)?: None Help from another person to put on and taking off regular upper body clothing?: None Help from another person to put on and taking off regular lower body clothing?: None 6 Click Score: 24   End of Session Nurse Communication: Mobility status;Precautions  Activity Tolerance: Patient tolerated treatment well Patient left: in chair;with call bell/phone within reach;with family/visitor present  OT Visit Diagnosis: Unsteadiness on feet (R26.81)                Time: 8874-8841 OT Time Calculation (min): 33 min Charges:  OT General Charges $OT Visit: 1 Visit OT Evaluation $OT Eval Moderate Complexity: 1 Mod OT Treatments $Self Care/Home Management : 8-22 mins   Brynn, OTR/L  Acute Rehabilitation Services Office: [phone] .   Ely Molt 04/26/2023, 12:06 PM

## 2023-04-26 NOTE — TOC CM/SW Note (Signed)
 Transition of Care [facility]) - Inpatient Brief Assessment   Patient Details  Name: Jonathon Evans. MRN: [id number] Date of Birth: 1960-04-28  Transition of Care [facility] CM/SW Contact:    Inocente GORMAN Kindle, LCSW Phone Number: 04/26/2023, 12:59 PM   Clinical Narrative: Patient admitted from home with spouse. No current TOC needs identified but please place consult if needs arise.    Transition of Care Asessment: Insurance and Status: Insurance coverage has been reviewed Patient has primary care physician: Yes Home environment has been reviewed: From home Prior level of function:: Independent Prior/Current Home Services: No current home services Social Drivers of Health Review: SDOH reviewed no interventions necessary Readmission risk has been reviewed: Yes Transition of care needs: no transition of care needs at this time

## 2023-04-26 NOTE — Telephone Encounter (Signed)
 Patient Product/process Development Scientist completed.    The patient is insured through [employer]. Patient has [employer], may use a copay card, and/or apply for patient assistance if available.    Ran test claim for Eliquis  5 mg and the current 30 day co-pay is $45.00.  Ran test claim for Xarelto 20 mg and the current 30 day co-pay is $45.00.  This test claim was processed through [facility]- copay amounts may vary at other pharmacies due to pharmacy/plan contracts, or as the patient moves through the different stages of their insurance plan.     Reyes Sharps, CPHT Pharmacy Technician III Certified Patient Advocate [facility] Patient Advocate Team Direct Number: [phone]  Fax: [phone]

## 2023-04-26 NOTE — Discharge Summary (Addendum)
 Stroke Discharge Summary  Patient ID: Jonathon Evans   MRN: [id number]      DOB: [birth year]  Date of Admission: 04/25/2023 Date of Discharge: 04/27/2023  Attending Physician:  Stroke, Md, MD Consultant(s):    None  Patient's PCP:  Eartha Gold, MD  DISCHARGE PRIMARY DIAGNOSIS:  Acute Ischemic Infarct:  left terminal internal capsule punctate infarct due to distal left M1 occlusion s/p mechanical thrombectomy and TNK Etiology: Likely embolic in the setting of atrial fibrillation without anticoagulation  Patient Active Problem List   Diagnosis Date Noted   Acute ischemic stroke (HCC) 04/25/2023   Middle cerebral artery embolism, left 04/25/2023   Hypertrophy of prostate without urinary obstruction and other lower urinary tract symptoms (LUTS) 08/18/2013   Hypogonadism male 08/18/2013   Elevated prostate specific antigen (PSA) 08/16/2013     Secondary Diagnoses: Hypertension Hyperlipidemia Atrial fibrillation Morbid obesity  Allergies as of 04/27/2023   No Known Allergies      Medication List     STOP taking these medications    aspirin EC 81 MG tablet       TAKE these medications    apixaban  5 MG Tabs tablet Commonly known as: ELIQUIS  Take 1 tablet (5 mg total) by mouth 2 (two) times daily.   cyanocobalamin 1000 MCG tablet Commonly known as: VITAMIN B12 Take 1,000 mcg by mouth daily.   diltiazem  120 MG 24 hr capsule Commonly known as: CARDIZEM  CD Take 1 capsule (120 mg total) by mouth daily.   FISH OIL PO Take 1 capsule by mouth daily.   multivitamin tablet Take 1 tablet by mouth daily.   omeprazole  20 MG capsule Commonly known as: PRILOSEC Take 1 capsule (20 mg total) by mouth daily.   rosuvastatin  10 MG tablet Commonly known as: CRESTOR  Take 10 mg by mouth daily.   sildenafil 100 MG tablet Commonly known as: VIAGRA Take 100 mg by mouth daily as needed for erectile dysfunction.        LABORATORY STUDIES CBC    Component  Value Date/Time   WBC 6.6 04/25/2023 0943   RBC 5.71 04/25/2023 0943   HGB 16.1 04/25/2023 0943   HCT 48.2 04/25/2023 0943   PLT 187 04/25/2023 0943   MCV 84.4 04/25/2023 0943   MCH 28.2 04/25/2023 0943   MCHC 33.4 04/25/2023 0943   RDW 13.1 04/25/2023 0943   LYMPHSABS 1.8 04/25/2023 0943   MONOABS 0.6 04/25/2023 0943   EOSABS 0.2 04/25/2023 0943   BASOSABS 0.1 04/25/2023 0943   CMP    Component Value Date/Time   NA 140 04/25/2023 0943   K 4.3 04/25/2023 0943   CL 103 04/25/2023 0943   CO2 24 04/25/2023 0943   GLUCOSE 126 (H) 04/25/2023 0943   BUN 18 04/25/2023 0943   CREATININE 1.22 04/25/2023 0943   CALCIUM  9.2 04/25/2023 0943   PROT 7.6 04/25/2023 0943   ALBUMIN 4.2 04/25/2023 0943   AST 25 04/25/2023 0943   ALT 17 04/25/2023 0943   ALKPHOS 55 04/25/2023 0943   BILITOT 1.2 04/25/2023 0943   GFRNONAA >60 04/25/2023 0943   COAGS Lab Results  Component Value Date   INR 1.0 04/25/2023   Lipid Panel    Component Value Date/Time   CHOL 150 04/26/2023 0524   TRIG 177 (H) 04/26/2023 0524   HDL 55 04/26/2023 0524   CHOLHDL 2.7 04/26/2023 0524   VLDL 35 04/26/2023 0524   LDLCALC 60 04/26/2023 0524   HgbA1C  Lab  Results  Component Value Date   HGBA1C 6.2 (H) 04/25/2023   Urine Drug Screen negative Alcohol Level    Component Value Date/Time   [facility] <10 04/25/2023 0943     SIGNIFICANT DIAGNOSTIC STUDIES MR BRAIN WO CONTRAST Result Date: 04/26/2023 CLINICAL DATA:  Stroke, follow-up. EXAM: MRI HEAD WITHOUT CONTRAST MRA HEAD WITHOUT CONTRAST TECHNIQUE: Multiplanar, multi-echo pulse sequences of the brain and surrounding structures were acquired without intravenous contrast. Angiographic images of the Circle of Willis were acquired using MRA technique without intravenous contrast. COMPARISON:  Head CT April 25, 2023. FINDINGS: MRI HEAD FINDINGS Brain: Punctate for close of restricted diffusion in the anterior/inferior aspect of the left external capsule, consistent  with acute infarct. No hemorrhage, hydrocephalus, extra-axial collection or mass lesion. Small amount of scattered foci of T2 hyperintensity are seen within the white matter of the cerebral hemispheres, nonspecific. Vascular: Normal flow voids. Skull and upper cervical spine: Normal marrow signal. Sinuses/Orbits: Mild mucosal thickening of the maxillary sinuses with mucous retention cysts on the left. Bilateral lens surgery. Other: None. MRA HEAD FINDINGS Anterior circulation: The visualized portions of the distal cervical and intracranial internal carotid arteries are widely patent with normal flow related enhancement. The bilateral anterior cerebral arteries and middle cerebral arteries are widely patent with anterograde flow without high-grade flow-limiting stenosis or proximal branch occlusion. No intracranial aneurysm within the anterior circulation. Posterior circulation: The vertebral arteries are widely patent with anterograde flow. Vertebrobasilar junction and basilar artery are widely patent with anterograde flow without evidence of basilar stenosis or aneurysm. Posterior cerebral arteries are normal bilaterally. No intracranial aneurysm within the posterior circulation. Anatomic variants: None significant. IMPRESSION: 1. Punctate acute infarct in the anterior/inferior aspect of the left external capsule. 2. Mild chronic microvascular ischemic changes of the white matter. 3. Unremarkable MRA of the head. Electronically Signed   By: Katyucia  de Macedo Rodrigues M.D.   On: 04/26/2023 10:51   MR ANGIO HEAD WO CONTRAST Result Date: 04/26/2023 CLINICAL DATA:  Stroke, follow-up. EXAM: MRI HEAD WITHOUT CONTRAST MRA HEAD WITHOUT CONTRAST TECHNIQUE: Multiplanar, multi-echo pulse sequences of the brain and surrounding structures were acquired without intravenous contrast. Angiographic images of the Circle of Willis were acquired using MRA technique without intravenous contrast. COMPARISON:  Head CT April 25, 2023. FINDINGS: MRI HEAD FINDINGS Brain: Punctate for close of restricted diffusion in the anterior/inferior aspect of the left external capsule, consistent with acute infarct. No hemorrhage, hydrocephalus, extra-axial collection or mass lesion. Small amount of scattered foci of T2 hyperintensity are seen within the white matter of the cerebral hemispheres, nonspecific. Vascular: Normal flow voids. Skull and upper cervical spine: Normal marrow signal. Sinuses/Orbits: Mild mucosal thickening of the maxillary sinuses with mucous retention cysts on the left. Bilateral lens surgery. Other: None. MRA HEAD FINDINGS Anterior circulation: The visualized portions of the distal cervical and intracranial internal carotid arteries are widely patent with normal flow related enhancement. The bilateral anterior cerebral arteries and middle cerebral arteries are widely patent with anterograde flow without high-grade flow-limiting stenosis or proximal branch occlusion. No intracranial aneurysm within the anterior circulation. Posterior circulation: The vertebral arteries are widely patent with anterograde flow. Vertebrobasilar junction and basilar artery are widely patent with anterograde flow without evidence of basilar stenosis or aneurysm. Posterior cerebral arteries are normal bilaterally. No intracranial aneurysm within the posterior circulation. Anatomic variants: None significant. IMPRESSION: 1. Punctate acute infarct in the anterior/inferior aspect of the left external capsule. 2. Mild chronic microvascular ischemic changes of the white matter. 3.  Unremarkable MRA of the head. Electronically Signed   By: Katyucia  de Macedo Rodrigues M.D.   On: 04/26/2023 10:51   IR PERCUTANEOUS ART THROMBECTOMY/INFUSION INTRACRANIAL INC DIAG ANGIO Result Date: 04/26/2023 INDICATION: New onset aphasia with mild right-sided weakness. Near occlusive thrombus in the distal left M1 segment extending into the origins of the inferior and  superior divisions on CT angiogram of the head and neck. EXAM: 1. EMERGENT LARGE VESSEL OCCLUSION THROMBOLYSIS (anterior CIRCULATION) COMPARISON:  CT angiogram of the head and neck of April 25, 2023. MEDICATIONS: No antibiotic was administered within 1 hour of the procedure. ANESTHESIA/SEDATION: General anesthesia. CONTRAST:  Omnipaque  300 approximately 70 cc. FLUOROSCOPY TIME:  Fluoroscopy Time: 13 minutes 0 seconds (1293 mGy). COMPLICATIONS: None immediate. TECHNIQUE: Following a full explanation of the procedure along with the potential associated complications, an informed witnessed consent was obtained. The risks of intracranial hemorrhage of 10%, worsening neurological deficit, ventilator dependency, death and inability to revascularize were all reviewed in detail with the patient's spouse. The patient was then put under general anesthesia by the Department of Anesthesiology at [facility]. The right groin was prepped and draped in the usual sterile fashion. Thereafter using modified Seldinger technique, transfemoral access into the right common femoral artery was obtained without difficulty. Over an 0.035 inch guidewire an 8 French 25 cm Pinnacle sheath was then inserted. Through this, and also over an 0.035 inch guidewire a combination of a 125 cm 6 [place] support Berenstein catheter inside of an 087 95 cm balloon guide catheter was advanced to the aortic arch region, and selectively positioned in the left common carotid artery, and then in the distal left internal carotid artery. FINDINGS: The left common carotid arteriogram demonstrates the left external carotid artery and its major branches to be widely patent. The left internal carotid artery at the bulb to the cranial skull base is widely patent. The petrous, the cavernous and the supraclinoid left ICA demonstrate normal patency. The left anterior cerebral artery opacifies into the capillary and venous phases. 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 The superior division demonstrates near complete occlusion of an inferior branch of the superior division in the M3 region. PROCEDURE: Through the balloon guide catheter in the distal left internal carotid artery, combination of an 062 132 cm Penumbra aspiration catheter with an 043 160 cm distal aspiration catheter was advanced over an 018 inch standard Aristotle micro guidewire with a moderate J configuration and was advanced to the supraclinoid left ICA. Using a torque device, the occluded branch of the superior division was accessed with an 018 inch micro guidewire followed by the distal aspiration catheter. The 062 aspiration catheter was engaged just distal to the occluded inferior division branch. The guidewire was removed. Good aspiration obtained from the hub of the 043 distal aspiration catheter. At this time, aspiration was applied at the hub of the 062 Penumbra aspiration catheter for a minute and a half and also at the hub of the micro catheter which was removed promptly. A minute and a half of aspiration at the hub of the 062 aspiration catheter, this was removed. Following reversal of flow arrest, a control arteriogram performed through the left ICA demonstrated complete revascularization of the previously occluded branch of the superior division achieving a TICI 3 revascularization. Mild spasm of the origin of the superior division responded to 2 aliquots of 25 mcg of nitroglycerin  intra-arterially. A final control arteriogram performed through the balloon guide catheter in the  common carotid artery on the left demonstrated patency of the internal carotid artery intracranially and extra cranially. The left middle cerebral artery continued to demonstrate a TICI 3 revascularization. The left anterior cerebral artery and its branches remained widely patent. A flat panel CT of the brain demonstrated no evidence of intracranial hemorrhagic  complications. Manual compression was then held in the right groin puncture site for 25 minutes with quick clot due to unsuccessful with the 8 French Angio-Seal closure device. Distal pulses remained Dopplerable unchanged from prior to the procedure. The patient's general anesthesia was reversed, and patient was extubated without difficulty. Upon recovery, the patient was able to move all fours equally. He continued to have moderate difficulty with naming and identifying objects, and comprehension. He was then transferred to neuro ICU for post revascularization care. IMPRESSION: Status post endovascular revascularization of occluded left middle cerebral artery inferior division branch achieving a TICI 3 revascularization with 1 pass with a 062 Penumbra aspiration catheter and an 043 aspiration catheter. PLAN: As per referring MD. Electronically Signed   By: Luellen Sages M.D.   On: 04/26/2023 08:07   IR CT Head Ltd Result Date: 04/26/2023 INDICATION: New onset aphasia with mild right-sided weakness. Near occlusive thrombus in the distal left M1 segment extending into the origins of the inferior and superior divisions on CT angiogram of the head and neck. EXAM: 1. EMERGENT LARGE VESSEL OCCLUSION THROMBOLYSIS (anterior CIRCULATION) COMPARISON:  CT angiogram of the head and neck of April 25, 2023. MEDICATIONS: No antibiotic was administered within 1 hour of the procedure. ANESTHESIA/SEDATION: General anesthesia. CONTRAST:  Omnipaque  300 approximately 70 cc. FLUOROSCOPY TIME:  Fluoroscopy Time: 13 minutes 0 seconds (1293 mGy). COMPLICATIONS: None immediate. TECHNIQUE: Following a full explanation of the procedure along with the potential associated complications, an informed witnessed consent was obtained. The risks of intracranial hemorrhage of 10%, worsening neurological deficit, ventilator dependency, death and inability to revascularize were all reviewed in detail with the patient's spouse. The patient was  then put under general anesthesia by the Department of Anesthesiology at [facility]. The right groin was prepped and draped in the usual sterile fashion. Thereafter using modified Seldinger technique, transfemoral access into the right common femoral artery was obtained without difficulty. Over an 0.035 inch guidewire an 8 French 25 cm Pinnacle sheath was then inserted. Through this, and also over an 0.035 inch guidewire a combination of a 125 cm 6 [place] support Berenstein catheter inside of an 087 95 cm balloon guide catheter was advanced to the aortic arch region, and selectively positioned in the left common carotid artery, and then in the distal left internal carotid artery. FINDINGS: The left common carotid arteriogram demonstrates the left external carotid artery and its major branches to be widely patent. The left internal carotid artery at the bulb to the cranial skull base is widely patent. The petrous, the cavernous and the supraclinoid left ICA demonstrate normal patency. The left anterior cerebral artery opacifies into the capillary and venous phases. 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 The superior division demonstrates near complete occlusion of an inferior branch of the superior division in the M3 region. PROCEDURE: Through the balloon guide catheter in the distal left internal carotid artery, combination of an 062 132 cm Penumbra aspiration catheter with an 043 160 cm distal aspiration catheter was advanced over an 018 inch standard Aristotle micro guidewire with a moderate J configuration and was advanced to the supraclinoid left ICA. Using a torque  device, the occluded branch of the superior division was accessed with an 018 inch micro guidewire followed by the distal aspiration catheter. The 062 aspiration catheter was engaged just distal to the occluded inferior division branch. The guidewire was removed. Good aspiration obtained from  the hub of the 043 distal aspiration catheter. At this time, aspiration was applied at the hub of the 062 Penumbra aspiration catheter for a minute and a half and also at the hub of the micro catheter which was removed promptly. A minute and a half of aspiration at the hub of the 062 aspiration catheter, this was removed. Following reversal of flow arrest, a control arteriogram performed through the left ICA demonstrated complete revascularization of the previously occluded branch of the superior division achieving a TICI 3 revascularization. Mild spasm of the origin of the superior division responded to 2 aliquots of 25 mcg of nitroglycerin  intra-arterially. A final control arteriogram performed through the balloon guide catheter in the common carotid artery on the left demonstrated patency of the internal carotid artery intracranially and extra cranially. The left middle cerebral artery continued to demonstrate a TICI 3 revascularization. The left anterior cerebral artery and its branches remained widely patent. A flat panel CT of the brain demonstrated no evidence of intracranial hemorrhagic complications. Manual compression was then held in the right groin puncture site for 25 minutes with quick clot due to unsuccessful with the 8 French Angio-Seal closure device. Distal pulses remained Dopplerable unchanged from prior to the procedure. The patient's general anesthesia was reversed, and patient was extubated without difficulty. Upon recovery, the patient was able to move all fours equally. He continued to have moderate difficulty with naming and identifying objects, and comprehension. He was then transferred to neuro ICU for post revascularization care. IMPRESSION: Status post endovascular revascularization of occluded left middle cerebral artery inferior division branch achieving a TICI 3 revascularization with 1 pass with a 062 Penumbra aspiration catheter and an 043 aspiration catheter. PLAN: As per referring  MD. Electronically Signed   By: Luellen Sages M.D.   On: 04/26/2023 08:07   ECHOCARDIOGRAM COMPLETE Result Date: 04/25/2023    ECHOCARDIOGRAM REPORT   Patient Name:   Jonathon Evans. Date of Exam: 04/25/2023 Medical Rec #:  [id number]        Height:       73.0 in Accession #:    [id number]       Weight:       335.0 lb Date of Birth:  [birth year]        BSA:          2.679 m Patient Age:    62 years         BP:           156/85 mmHg Patient Gender: M                HR:           81 bpm. Exam Location:  Inpatient Procedure: 2D Echo, Cardiac Doppler, Color Doppler and Intracardiac            Opacification Agent Indications:    Stroke I63.9  History:        Patient has prior history of Echocardiogram examinations, most                 recent 08/26/2022. Stroke.  Sonographer:    Terrilee Few RCS Referring Phys: [id number] Audrene Lease IMPRESSIONS  1. Left ventricular ejection fraction, by estimation, is  55 to 60%. The left ventricle has normal function. The left ventricle has no regional wall motion abnormalities. Left ventricular diastolic function could not be evaluated.  2. Right ventricular systolic function is normal. The right ventricular size is mildly enlarged. There is normal pulmonary artery systolic pressure. The estimated right ventricular systolic pressure is 14.2 mmHg.  3. Left atrial size was severely dilated.  4. Right atrial size was mildly dilated.  5. The mitral valve is grossly normal. Trivial mitral valve regurgitation. No evidence of mitral stenosis.  6. The aortic valve is tricuspid. There is mild calcification of the aortic valve. Aortic valve regurgitation is not visualized. No aortic stenosis is present.  7. The inferior vena cava is normal in size with greater than 50% respiratory variability, suggesting right atrial pressure of 3 mmHg. Comparison(s): No significant change from prior study. FINDINGS  Left Ventricle: Left ventricular ejection fraction, by estimation, is 55 to 60%. The  left ventricle has normal function. The left ventricle has no regional wall motion abnormalities. Definity  contrast agent was given IV to delineate the left ventricular  endocardial borders. The left ventricular internal cavity size was normal in size. There is no left ventricular hypertrophy. Left ventricular diastolic function could not be evaluated due to atrial fibrillation. Left ventricular diastolic function could  not be evaluated. Right Ventricle: The right ventricular size is mildly enlarged. No increase in right ventricular wall thickness. Right ventricular systolic function is normal. There is normal pulmonary artery systolic pressure. The tricuspid regurgitant velocity is 1.67  m/s, and with an assumed right atrial pressure of 3 mmHg, the estimated right ventricular systolic pressure is 14.2 mmHg. Left Atrium: Left atrial size was severely dilated. Right Atrium: Right atrial size was mildly dilated. Pericardium: There is no evidence of pericardial effusion. Mitral Valve: The mitral valve is grossly normal. Trivial mitral valve regurgitation. No evidence of mitral valve stenosis. Tricuspid Valve: The tricuspid valve is grossly normal. Tricuspid valve regurgitation is trivial. No evidence of tricuspid stenosis. Aortic Valve: The aortic valve is tricuspid. There is mild calcification of the aortic valve. Aortic valve regurgitation is not visualized. No aortic stenosis is present. Aortic valve peak gradient measures 9.9 mmHg. Pulmonic Valve: The pulmonic valve was grossly normal. Pulmonic valve regurgitation is not visualized. No evidence of pulmonic stenosis. Aorta: The aortic root and ascending aorta are structurally normal, with no evidence of dilitation. Venous: The inferior vena cava is normal in size with greater than 50% respiratory variability, suggesting right atrial pressure of 3 mmHg. IAS/Shunts: The atrial septum is grossly normal.  LEFT VENTRICLE PLAX 2D LVIDd:         5.80 cm   Diastology  LVIDs:         4.10 cm   LV e' medial:    11.30 cm/s LV PW:         1.20 cm   LV E/e' medial:  9.7 LV IVS:        1.00 cm   LV e' lateral:   17.50 cm/s LVOT diam:     2.50 cm   LV E/e' lateral: 6.3 LV SV:         74 LV SV Index:   27 LVOT Area:     4.91 cm  RIGHT VENTRICLE             IVC RV S prime:     10.30 cm/s  IVC diam: 2.40 cm TAPSE (M-mode): 1.4 cm LEFT ATRIUM  Index        RIGHT ATRIUM           Index LA diam:      5.30 cm  1.98 cm/m   RA Area:     23.10 cm LA Vol (A4C): 144.0 ml 53.75 ml/m  RA Volume:   61.20 ml  22.84 ml/m  AORTIC VALVE AV Area (Vmax): 3.16 cm AV Vmax:        157.00 cm/s AV Peak Grad:   9.9 mmHg LVOT Vmax:      101.00 cm/s LVOT Vmean:     60.300 cm/s LVOT VTI:       0.150 m  AORTA Ao Root diam: 3.90 cm Ao Asc diam:  3.90 cm MITRAL VALVE                TRICUSPID VALVE MV Area (PHT): 3.70 cm     TR Peak grad:   11.2 mmHg MV Decel Time: 205 msec     TR Vmax:        167.00 cm/s MV E velocity: 109.50 cm/s                             SHUNTS                             Systemic VTI:  0.15 m                             Systemic Diam: 2.50 cm Jackquelyn Mass MD Electronically signed by Jackquelyn Mass MD Signature Date/Time: 04/25/2023/6:04:39 PM    Final    CT ANGIO HEAD NECK W WO CM (CODE STROKE) Result Date: 04/25/2023 CLINICAL DATA:  Provided history: Expressive aphasia. Last known well 7 a.m. EXAM: CT ANGIOGRAPHY HEAD AND NECK WITH AND WITHOUT CONTRAST TECHNIQUE: Multidetector CT imaging of the head and neck was performed using the standard protocol during bolus administration of intravenous contrast. Multiplanar CT image reconstructions and MIPs were obtained to evaluate the vascular anatomy. Carotid stenosis measurements (when applicable) are obtained utilizing NASCET criteria, using the distal internal carotid diameter as the denominator. RADIATION DOSE REDUCTION: This exam was performed according to the departmental dose-optimization program which includes automated  exposure control, adjustment of the mA and/or kV according to patient size and/or use of iterative reconstruction technique. CONTRAST:  75mL OMNIPAQUE  IOHEXOL  350 MG/ML SOLN COMPARISON:  Non-contrast head CT performed earlier today 04/25/2023. FINDINGS: CTA NECK FINDINGS Aortic arch: Standard aortic branching. Atherosclerotic plaque within the proximal major branch vessels of the neck. No hemodynamically significant innominate or proximal subclavian artery stenosis. Right carotid system: CCA and ICA patent within the neck without stenosis or significant atherosclerotic disease Left carotid system: CCA and ICA patent within the neck without stenosis. Nonstenotic calcified plaque at the CCA origin and within the proximal ICA. Vertebral arteries: Patent within the neck without stenosis or significant atherosclerotic disease. The right vertebral artery is slightly dominant. Skeleton: Nonspecific reversal of the expected cervical lordosis. Spondylosis at the cervical and visualized upper thoracic levels. No acute fracture or aggressive osseous lesion. Other neck: No neck mass or cervical lymphadenopathy. Upper chest: No consolidation within the imaged lung apices. Review of the MIP images confirms the above findings CTA HEAD FINDINGS Anterior circulation: The intracranial internal carotid arteries are patent. Nonstenotic calcified plaque within both vessels. The left middle cerebral artery  M1 segment is occluded at its distal aspect. Occlusive thrombus extends into at least one adjacent proximal M2 left MCA vessel. The right middle cerebral artery M1 segment is patent. No right M2 proximal branch occlusion or high-grade proximal stenosis. The anterior cerebral arteries are patent. Posterior circulation: The intracranial vertebral arteries are patent. The basilar artery is patent. The posterior cerebral arteries are patent. Posterior communicating arteries are diminutive or absent, bilaterally. Venous sinuses: Within the  limitations of contrast timing, no convincing thrombus. Anatomic variants: As described. Review of the MIP images confirms the above findings CTA head impression called by telephone at the time of interpretation on 04/25/2023 at 10:10 am to provider [facility] , who verbally acknowledged these results. IMPRESSION: CTA neck: The common carotid, internal carotid and vertebral arteries are patent within the neck without stenosis. Non-stenotic atherosclerotic plaque at the left common carotid artery origin and within the proximal left internal carotid artery. CTA head: The left middle cerebral artery M1 segment is occluded at its distal aspect. Occlusive thrombus extends into at least one adjacent proximal M2 left middle cerebral artery vessel. Electronically Signed   By: Bascom Lily D.O.   On: 04/25/2023 10:24   CT HEAD CODE STROKE WO CONTRAST Result Date: 04/25/2023 CLINICAL DATA:  Code stroke. Provided history: Neuro deficit, acute, stroke suspected. Expressive aphasia. EXAM: CT HEAD WITHOUT CONTRAST TECHNIQUE: Contiguous axial images were obtained from the base of the skull through the vertex without intravenous contrast. RADIATION DOSE REDUCTION: This exam was performed according to the departmental dose-optimization program which includes automated exposure control, adjustment of the mA and/or kV according to patient size and/or use of iterative reconstruction technique. COMPARISON:  None. FINDINGS: Brain: There is no acute intracranial hemorrhage. No demarcated cortical infarct. No extra-axial fluid collection. No evidence of an intracranial mass. No midline shift. Vascular: No hyperdense vessel. Skull: No calvarial fracture or aggressive osseous lesion. Sinuses/Orbits: No mass or acute finding within the imaged orbits. Minimal mucosal thickening within the left frontal, bilateral ethmoid and bilateral maxillary sinuses at the imaged levels. ASPECTS (Alberta Stroke Program Early CT Score) - Ganglionic level  infarction (caudate, lentiform nuclei, internal capsule, insula, M1-M3 cortex): 7 - Supraganglionic infarction (M4-M6 cortex): 3 Total score (0-10 with 10 being normal): 10 No evidence of an acute intracranial abnormality on the non-contrast head CT. These results were communicated to Dr. Doretta Gant at 10:09 amon 1/13/2025by text page via the [facility] messaging system. IMPRESSION: No evidence of an acute intracranial abnormality.  ASPECTS is 10. Electronically Signed   By: Bascom Lily D.O.   On: 04/25/2023 10:13       HISTORY OF PRESENT ILLNESS 64 y.o. patient with history of A-fib not on anticoagulation was admitted with sudden onset aphasia and right-sided weakness.  HOSPITAL COURSE Patient was treated with TNK at [facility] and transferred here for mechanical thrombectomy due to distal left M1 occlusion.  This procedure was successful with complete revascularization achieved with 1 pass.  MRI the following day reveals punctate acute infarct in the left external capsule.  Anticoagulation with Eliquis  was initiated after MRI resulted.  Acute Ischemic Infarct:  left internal capsule punctate infarct s/p mechanical thrombectomy and TNK Etiology: Likely embolic in the setting of atrial fibrillation without anticoagulation Code Stroke CT head No acute abnormality. ASPECTS 10.    CTA head & neck occlusion of the left MCA M1 segment with thrombus extending into at least 1 adjacent proximal M2 vessel MRI punctate acute infarct in the anterior/inferior aspect of the  left external capsule, mild chronic microvascular ischemic changes MRA no LVO or hemodynamically significant stenosis 2D Echo EF 55 to 60%, severely dilated left atrium, mildly dilated right atrium, normal atrial septum LDL 60 HgbA1c 6.2 VTE prophylaxis -fully anticoagulated with Eliquis  aspirin 81 mg daily prior to admission, now on Eliquis  (apixaban ) daily  Therapy recommendations:  No follow up needed  Disposition: home     Atrial  fibrillation Home Meds: Diltiazem  120 mg daily, patient was on on anticoagulation due to low CHA2DS2-VASc score Continue telemetry monitoring Begin anticoagulation with Eliquis    Hypertension Home meds: None Stable Blood Pressure Goal: SBP 120-160 for first 24 hours then less than 180    Hyperlipidemia Home meds: Rosuvastatin  10 mg daily, resumed in hospital LDL 60, goal < 70 High intensity statin not indicated as LDL is below goal Continue statin at discharge   Other Stroke Risk Factors Obesity, Body mass index is 44.2 kg/m., BMI >/= 30 associated with increased stroke risk, recommend weight loss, diet and exercise as appropriate     RN Pressure Injury Documentation:     DISCHARGE EXAM  PHYSICAL EXAM General:  Alert, well-nourished, well-developed patient in no acute distress Psych:  Mood and affect appropriate for situation CV: Regular rate and rhythm on monitor Respiratory:  Regular, unlabored respirations on room air GI: Abdomen soft and nontender  NEURO:  Mental Status: AA&Ox3  Speech/Language: speech is without dysarthria or aphasia.    Cranial Nerves:  II: PERRL. III, IV, VI: EOMI. Eyelids elevate symmetrically.  V: Sensation is intact to light touch and symmetrical to face.  VII: Smile is symmetrical.  VIII: hearing intact to voice. IX, X: Phonation is normal.  XII: tongue is midline without fasciculations. Motor: Able to move all 4 extremities with good antigravity strength Tone: is normal and bulk is normal Sensation- Intact to light touch bilaterally. Coordination: FTN intact bilaterally Gait- deferred  1a Level of Conscious.: 0 1b LOC Questions: 0 1c LOC Commands: 0 2 Best Gaze: 0 3 Visual: 0 4 Facial Palsy: 0 5a Motor Arm - left: 0 5b Motor Arm - Right: 0 6a Motor Leg - Left: 0 6b Motor Leg - Right: 0 7 Limb Ataxia: 0 8 Sensory: 0 9 Best Language: 0 10 Dysarthria: 0 11 Extinct. and Inatten.: 0 TOTAL: 0   Discharge Diet       Diet    Diet regular Room service appropriate? Yes; Fluid consistency: Thin   liquids  DISCHARGE PLAN Disposition: Home Eliquis  (apixaban ) daily for secondary stroke prevention  Ongoing stroke risk factor control by Primary Care Physician at time of discharge Follow-up PCP Eartha Gold, MD in 2 weeks. Follow-up in [facility] in 8 weeks, office to schedule an appointment.   32 minutes were spent preparing discharge.  Cortney E Bucky Cardinal , MSN, AGACNP-BC [facility] See Amion for schedule and pager information 04/27/2023 11:08 AM   I have personally obtained history,examined this patient, reviewed notes, independently viewed imaging studies, participated in medical decision making and plan of care.ROS completed by me personally and pertinent positives fully documented  I have made any additions or clarifications directly to the above note. Agree with note above.    Ardella Beaver, MD Medical Director [facility] Pager: [phone] 04/27/2023 3:49 PM

## 2023-04-26 NOTE — Discharge Instructions (Signed)
Information on my medicine - ELIQUIS? (apixaban) ? ?This medication education was reviewed with me or my healthcare representative as part of my discharge preparation.  ? ?Why was Eliquis? prescribed for you? ?Eliquis? was prescribed for you to reduce the risk of forming blood clots that can cause a stroke if you have a medical condition called atrial fibrillation (a type of irregular heartbeat) OR to reduce the risk of a blood clots forming after orthopedic surgery. ? ?What do You need to know about Eliquis? ? ?Take your Eliquis? TWICE DAILY - one tablet in the morning and one tablet in the evening with or without food.  It would be best to take the doses about the same time each day. ? ?If you have difficulty swallowing the tablet whole please discuss with your pharmacist how to take the medication safely. ? ?Take Eliquis? exactly as prescribed by your doctor and DO NOT stop taking Eliquis? without talking to the doctor who prescribed the medication.  Stopping may increase your risk of developing a new clot or stroke.  Refill your prescription before you run out. ? ?After discharge, you should have regular check-up appointments with your healthcare provider that is prescribing your Eliquis?.  In the future your dose may need to be changed if your kidney function or weight changes by a significant amount or as you get older. ? ?What do you do if you miss a dose? ?If you miss a dose, take it as soon as you remember on the same day and resume taking twice daily.  Do not take more than one dose of ELIQUIS at the same time. ? ?Important Safety Information ?A possible side effect of Eliquis? is bleeding. You should call your healthcare provider right away if you experience any of the following: ?Bleeding from an injury or your nose that does not stop. ?Unusual colored urine (red or dark brown) or unusual colored stools (red or black). ?Unusual bruising for unknown reasons. ?A serious fall or if you hit your head (even  if there is no bleeding). ? ?Some medicines may interact with Eliquis? and might increase your risk of bleeding or clotting while on Eliquis?. To help avoid this, consult your healthcare provider or pharmacist prior to using any new prescription or non-prescription medications, including herbals, vitamins, non-steroidal anti-inflammatory drugs (NSAIDs) and supplements. ? ?This website has more information on Eliquis? (apixaban): http://www.eliquis.com/eliquis/home ?  ?

## 2023-04-26 NOTE — Progress Notes (Signed)
 PHARMACY - ANTICOAGULATION CONSULT NOTE  Pharmacy Consult for Eliquis  Indication: atrial fibrillation  No Known Allergies  Patient Measurements: Weight: (!) 152 kg (335 lb) (Patient stated 335 pounds)   Vital Signs: Temp: 98.2 F (36.8 C) (01/14 1200) Temp Source: Oral (01/14 1200) BP: 117/73 (01/14 1215) Pulse Rate: 74 (01/14 1215)  Labs: Recent Labs    04/25/23 0943  HGB 16.1  HCT 48.2  PLT 187  APTT 25  LABPROT 13.4  INR 1.0  CREATININE 1.22    Estimated Creatinine Clearance: 94 mL/min (by C-G formula based on SCr of 1.22 mg/dL).   Medical History: Past Medical History:  Diagnosis Date   Arthritis    Right shoulder   GERD (gastroesophageal reflux disease)    Hyperlipidemia    Pre-diabetes    Sleep apnea    CPAP    Medications:  Scheduled:   Chlorhexidine  Gluconate Cloth  6 each Topical Daily   diltiazem   120 mg Oral Daily   labetalol   20 mg Intravenous Once   pantoprazole   40 mg Oral QHS   rosuvastatin   10 mg Oral Daily   Infusions:   sodium chloride  Stopped (04/26/23 0715)   clevidipine  Stopped (04/26/23 1120)    Assessment: 63 yo male with a history of afib not on anticoagulation prior to admission. Now presenting with distal M1 occlusion s/p TNK and thrombectomy. Repeat MRA this morning was unremarkable. Pharmacy has been consulted to dose eliquis  in the setting of atrial fibrillation   Goal of Therapy:  Monitor platelets by anticoagulation protocol: Yes   Plan:  Start apixaban  5 mg twice daily  Monitor CBC and for s/sx of bleeding   Nhat Hearne I Kathlen Sakurai 04/26/2023,12:37 PM

## 2023-04-26 NOTE — Progress Notes (Signed)
 PT Cancellation Note  Patient Details Name: Bard Mccabe Gloria. MRN: [id number] DOB: [birth year]   Cancelled Treatment:    Reason Eval/Treat Not Completed: PT screened, no needs identified, will sign off - per OT, pt at baseline functioning and is independent with mobility at this time. PT to sign off, please reconsult if needed.   Sammi Stolarz S, PT DPT Acute Rehabilitation Services Secure Chat Preferred  Office [phone]    Johana FORBES Kingdom 04/26/2023, 2:12 PM

## 2023-04-26 NOTE — Evaluation (Signed)
 Speech Language Pathology Evaluation Patient Details Name: Jonathon Evans. 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   Hypertrophy of prostate without urinary obstruction and other lower urinary tract symptoms (LUTS) 08/18/2013   Hypogonadism male 08/18/2013   Elevated prostate specific antigen (PSA) 08/16/2013   Past Medical History:  Past Medical History:  Diagnosis Date   Arthritis    Right shoulder   GERD (gastroesophageal reflux disease)    Hyperlipidemia    Pre-diabetes    Sleep apnea    CPAP   Past Surgical History:  Past Surgical History:  Procedure Laterality Date   CATARACT EXTRACTION W/PHACO Right 02/12/2019   Procedure: CATARACT EXTRACTION PHACO AND INTRAOCULAR LENS PLACEMENT (IOC) RIGHT 00:44.3            13.1%          5.86;  Surgeon: Myrna Adine Anes, MD;  Location: [facility];  Service: Ophthalmology;  Laterality: Right;  sleep apnea   CATARACT EXTRACTION W/PHACO Left 07/19/2022   Procedure: CATARACT EXTRACTION PHACO AND INTRAOCULAR LENS PLACEMENT (IOC) LEFT  2.50  00:20.0;  Surgeon: Myrna Adine Anes, MD;  Location: [facility];  Service: Ophthalmology;  Laterality: Left;   colonoscopy and upper endoscopy     COLONOSCOPY WITH PROPOFOL  N/A 02/16/2016   Procedure: COLONOSCOPY WITH PROPOFOL ;  Surgeon: Gladis RAYMOND Mariner, MD;  Location: [facility] END[facility];  Service: Endoscopy;  Laterality: N/A;   IR CT HEAD LTD  04/25/2023   IR PERCUTANEOUS ART THROMBECTOMY/INFUSION INTRACRANIAL INC DIAG ANGIO  04/25/2023   HPI:  Patient is a 63 y.o. male with PMH: a-fib not on anticoagulation who presented to [facility] after onset of aphasia at work in AM of 04/25/23. He initially went to [facility] who transferred him to [facility] ED. CT  head negative for ICH. He received TNK at 0957 on 04/25/23. CTA showed distal left M1 occlusion and he was transfered to [facility] for emergent thrombectomy.   Assessment / Plan / Recommendation Clinical Impression  Patient is not currently presenting with any impairments in the areas of cognition, language or speech function. He indicated that intial symptoms did include expressive aphasia but that resolved after he got the treatment of TNK. SLP assessed patient's cognitive-linguistic and speech function and he was WNL in all areas. In addition, he denies any impairments in those areas.    SLP Assessment  SLP Recommendation/Assessment: Patient does not need any further Speech Lanaguage Pathology Services SLP Visit Diagnosis: Aphasia (R47.01)    Recommendations for follow up therapy are one component of a multi-disciplinary discharge planning process, led by the attending physician.  Recommendations may be updated based on patient status, additional functional criteria and insurance authorization.    Follow Up Recommendations  No SLP follow up    Assistance Recommended at Discharge  None  Functional Status Assessment Patient has not had a recent decline in their functional status  Frequency and Duration   N/A        SLP Evaluation Cognition  Overall Cognitive Status: Within Functional Limits for tasks assessed Orientation Level: Oriented X4       Comprehension  Auditory Comprehension Overall Auditory Comprehension: Appears within functional limits for tasks assessed    Expression Expression Primary Mode of Expression: Verbal Verbal Expression Overall Verbal Expression:  Appears within functional limits for tasks assessed   Oral / Motor  Oral Motor/Sensory Function Overall Oral Motor/Sensory Function: Within functional limits Motor Speech Overall Motor Speech: Appears within functional limits for tasks assessed Respiration: Within functional limits Resonance: Within functional  limits Articulation: Within functional limitis Intelligibility: Intelligible Motor Planning: Witnin functional limits           Norleen IVAR Blase, MA, CCC-SLP Speech Therapy

## 2023-04-26 NOTE — Progress Notes (Addendum)
 STROKE TEAM PROGRESS NOTE   BRIEF HPI Mr. Jonathon Evans. is a 63 y.o. male with history of A-fib not on anticoagulation presenting with acute onset aphasia and right-sided weakness.  TNK was administered at [facility], and patient was transferred here for mechanical thrombectomy given distal left M1 occlusion.  Complete revascularization of distal left M1 segment was achieved with 1 pass.  MRI reveals punctate acute infarct in the left external capsule. NIH on Admission 10 at [facility], 2 here  SIGNIFICANT HOSPITAL EVENTS 1/13-patient admitted, TNK administered and mechanical thrombectomy performed  INTERIM HISTORY/SUBJECTIVE Patient has remained afebrile with stable vital signs.  He did require Cleviprex  at 1 point to maintain blood pressure within goals, but this has been tapered off.  He feels that his speech is back to normal.  He is agreeable with starting Eliquis  today. MRI scan of the brain is pending. OBJECTIVE  CBC    Component Value Date/Time   WBC 6.6 04/25/2023 0943   RBC 5.71 04/25/2023 0943   HGB 16.1 04/25/2023 0943   HCT 48.2 04/25/2023 0943   PLT 187 04/25/2023 0943   MCV 84.4 04/25/2023 0943   MCH 28.2 04/25/2023 0943   MCHC 33.4 04/25/2023 0943   RDW 13.1 04/25/2023 0943   LYMPHSABS 1.8 04/25/2023 0943   MONOABS 0.6 04/25/2023 0943   EOSABS 0.2 04/25/2023 0943   BASOSABS 0.1 04/25/2023 0943    BMET    Component Value Date/Time   NA 140 04/25/2023 0943   K 4.3 04/25/2023 0943   CL 103 04/25/2023 0943   CO2 24 04/25/2023 0943   GLUCOSE 126 (H) 04/25/2023 0943   BUN 18 04/25/2023 0943   CREATININE 1.22 04/25/2023 0943   CALCIUM  9.2 04/25/2023 0943   GFRNONAA >60 04/25/2023 0943    IMAGING past 24 hours MR BRAIN WO CONTRAST Result Date: 04/26/2023 CLINICAL DATA:  Stroke, follow-up. EXAM: MRI HEAD WITHOUT CONTRAST MRA HEAD WITHOUT CONTRAST TECHNIQUE: Multiplanar, multi-echo pulse sequences of the brain and surrounding structures were acquired without intravenous  contrast. Angiographic images of the Circle of Willis were acquired using MRA technique without intravenous contrast. COMPARISON:  Head CT April 25, 2023. FINDINGS: MRI HEAD FINDINGS Brain: Punctate for close of restricted diffusion in the anterior/inferior aspect of the left external capsule, consistent with acute infarct. No hemorrhage, hydrocephalus, extra-axial collection or mass lesion. Small amount of scattered foci of T2 hyperintensity are seen within the white matter of the cerebral hemispheres, nonspecific. Vascular: Normal flow voids. Skull and upper cervical spine: Normal marrow signal. Sinuses/Orbits: Mild mucosal thickening of the maxillary sinuses with mucous retention cysts on the left. Bilateral lens surgery. Other: None. MRA HEAD FINDINGS Anterior circulation: The visualized portions of the distal cervical and intracranial internal carotid arteries are widely patent with normal flow related enhancement. The bilateral anterior cerebral arteries and middle cerebral arteries are widely patent with anterograde flow without high-grade flow-limiting stenosis or proximal branch occlusion. No intracranial aneurysm within the anterior circulation. Posterior circulation: The vertebral arteries are widely patent with anterograde flow. Vertebrobasilar junction and basilar artery are widely patent with anterograde flow without evidence of basilar stenosis or aneurysm. Posterior cerebral arteries are normal bilaterally. No intracranial aneurysm within the posterior circulation. Anatomic variants: None significant. IMPRESSION: 1. Punctate acute infarct in the anterior/inferior aspect of the left external capsule. 2. Mild chronic microvascular ischemic changes of the white matter. 3. Unremarkable MRA of the head. Electronically Signed   By: Katyucia  de Macedo Rodrigues M.D.   On: 04/26/2023  10:51   MR ANGIO HEAD WO CONTRAST Result Date: 04/26/2023 CLINICAL DATA:  Stroke, follow-up. EXAM: MRI HEAD WITHOUT  CONTRAST MRA HEAD WITHOUT CONTRAST TECHNIQUE: Multiplanar, multi-echo pulse sequences of the brain and surrounding structures were acquired without intravenous contrast. Angiographic images of the Circle of Willis were acquired using MRA technique without intravenous contrast. COMPARISON:  Head CT April 25, 2023. FINDINGS: MRI HEAD FINDINGS Brain: Punctate for close of restricted diffusion in the anterior/inferior aspect of the left external capsule, consistent with acute infarct. No hemorrhage, hydrocephalus, extra-axial collection or mass lesion. Small amount of scattered foci of T2 hyperintensity are seen within the white matter of the cerebral hemispheres, nonspecific. Vascular: Normal flow voids. Skull and upper cervical spine: Normal marrow signal. Sinuses/Orbits: Mild mucosal thickening of the maxillary sinuses with mucous retention cysts on the left. Bilateral lens surgery. Other: None. MRA HEAD FINDINGS Anterior circulation: The visualized portions of the distal cervical and intracranial internal carotid arteries are widely patent with normal flow related enhancement. The bilateral anterior cerebral arteries and middle cerebral arteries are widely patent with anterograde flow without high-grade flow-limiting stenosis or proximal branch occlusion. No intracranial aneurysm within the anterior circulation. Posterior circulation: The vertebral arteries are widely patent with anterograde flow. Vertebrobasilar junction and basilar artery are widely patent with anterograde flow without evidence of basilar stenosis or aneurysm. Posterior cerebral arteries are normal bilaterally. No intracranial aneurysm within the posterior circulation. Anatomic variants: None significant. IMPRESSION: 1. Punctate acute infarct in the anterior/inferior aspect of the left external capsule. 2. Mild chronic microvascular ischemic changes of the white matter. 3. Unremarkable MRA of the head. Electronically Signed   By: Katyucia  de  Macedo Rodrigues M.D.   On: 04/26/2023 10:51   ECHOCARDIOGRAM COMPLETE Result Date: 04/25/2023    ECHOCARDIOGRAM REPORT   Patient Name:   Jonathon Evans. Date of Exam: 04/25/2023 Medical Rec #:  [id number]        Height:       73.0 in Accession #:    [id number]       Weight:       335.0 lb Date of Birth:  September 25, 1960        BSA:          2.679 m Patient Age:    62 years         BP:           156/85 mmHg Patient Gender: M                HR:           81 bpm. Exam Location:  Inpatient Procedure: 2D Echo, Cardiac Doppler, Color Doppler and Intracardiac            Opacification Agent Indications:    Stroke I63.9  History:        Patient has prior history of Echocardiogram examinations, most                 recent 08/26/2022. Stroke.  Sonographer:    Thea Norlander RCS Referring Phys: [id number] ROCKY JAYSON LIKES IMPRESSIONS  1. Left ventricular ejection fraction, by estimation, is 55 to 60%. The left ventricle has normal function. The left ventricle has no regional wall motion abnormalities. Left ventricular diastolic function could not be evaluated.  2. Right ventricular systolic function is normal. The right ventricular size is mildly enlarged. There is normal pulmonary artery systolic pressure. The estimated right ventricular systolic pressure is 14.2 mmHg.  3. Left atrial size  was severely dilated.  4. Right atrial size was mildly dilated.  5. The mitral valve is grossly normal. Trivial mitral valve regurgitation. No evidence of mitral stenosis.  6. The aortic valve is tricuspid. There is mild calcification of the aortic valve. Aortic valve regurgitation is not visualized. No aortic stenosis is present.  7. The inferior vena cava is normal in size with greater than 50% respiratory variability, suggesting right atrial pressure of 3 mmHg. Comparison(s): No significant change from prior study. FINDINGS  Left Ventricle: Left ventricular ejection fraction, by estimation, is 55 to 60%. The left ventricle has normal function.  The left ventricle has no regional wall motion abnormalities. Definity  contrast agent was given IV to delineate the left ventricular  endocardial borders. The left ventricular internal cavity size was normal in size. There is no left ventricular hypertrophy. Left ventricular diastolic function could not be evaluated due to atrial fibrillation. Left ventricular diastolic function could  not be evaluated. Right Ventricle: The right ventricular size is mildly enlarged. No increase in right ventricular wall thickness. Right ventricular systolic function is normal. There is normal pulmonary artery systolic pressure. The tricuspid regurgitant velocity is 1.67  m/s, and with an assumed right atrial pressure of 3 mmHg, the estimated right ventricular systolic pressure is 14.2 mmHg. Left Atrium: Left atrial size was severely dilated. Right Atrium: Right atrial size was mildly dilated. Pericardium: There is no evidence of pericardial effusion. Mitral Valve: The mitral valve is grossly normal. Trivial mitral valve regurgitation. No evidence of mitral valve stenosis. Tricuspid Valve: The tricuspid valve is grossly normal. Tricuspid valve regurgitation is trivial. No evidence of tricuspid stenosis. Aortic Valve: The aortic valve is tricuspid. There is mild calcification of the aortic valve. Aortic valve regurgitation is not visualized. No aortic stenosis is present. Aortic valve peak gradient measures 9.9 mmHg. Pulmonic Valve: The pulmonic valve was grossly normal. Pulmonic valve regurgitation is not visualized. No evidence of pulmonic stenosis. Aorta: The aortic root and ascending aorta are structurally normal, with no evidence of dilitation. Venous: The inferior vena cava is normal in size with greater than 50% respiratory variability, suggesting right atrial pressure of 3 mmHg. IAS/Shunts: The atrial septum is grossly normal.  LEFT VENTRICLE PLAX 2D LVIDd:         5.80 cm   Diastology LVIDs:         4.10 cm   LV e' medial:     11.30 cm/s LV PW:         1.20 cm   LV E/e' medial:  9.7 LV IVS:        1.00 cm   LV e' lateral:   17.50 cm/s LVOT diam:     2.50 cm   LV E/e' lateral: 6.3 LV SV:         74 LV SV Index:   27 LVOT Area:     4.91 cm  RIGHT VENTRICLE             IVC RV S prime:     10.30 cm/s  IVC diam: 2.40 cm TAPSE (M-mode): 1.4 cm LEFT ATRIUM            Index        RIGHT ATRIUM           Index LA diam:      5.30 cm  1.98 cm/m   RA Area:     23.10 cm LA Vol (A4C): 144.0 ml 53.75 ml/m  RA Volume:   61.20 ml  22.84 ml/m  AORTIC VALVE AV Area (Vmax): 3.16 cm AV Vmax:        157.00 cm/s AV Peak Grad:   9.9 mmHg LVOT Vmax:      101.00 cm/s LVOT Vmean:     60.300 cm/s LVOT VTI:       0.150 m  AORTA Ao Root diam: 3.90 cm Ao Asc diam:  3.90 cm MITRAL VALVE                TRICUSPID VALVE MV Area (PHT): 3.70 cm     TR Peak grad:   11.2 mmHg MV Decel Time: 205 msec     TR Vmax:        167.00 cm/s MV E velocity: 109.50 cm/s                             SHUNTS                             Systemic VTI:  0.15 m                             Systemic Diam: 2.50 cm Darryle Decent MD Electronically signed by Darryle Decent MD Signature Date/Time: 04/25/2023/6:04:39 PM    Final     Vitals:   04/26/23 1115 04/26/23 1130 04/26/23 1200 04/26/23 1215  BP: 117/68 118/71 118/72 117/73  Pulse: 76 88 70 74  Resp: (!) 24 (!) 26 (!) 23 17  Temp:   98.2 F (36.8 C)   TempSrc:   Oral   SpO2: 91% 91% 92% 92%  Weight:         PHYSICAL EXAM General:  Alert, well-nourished, well-developed patient in no acute distress Psych:  Mood and affect appropriate for situation CV: Regular rate and rhythm on monitor Respiratory:  Regular, unlabored respirations on room air GI: Abdomen soft and nontender   NEURO:  Mental Status: AA&Ox3, patient is able to give clear and coherent history Speech/Language: speech is without dysarthria or aphasia.  Naming, repetition, fluency, and comprehension intact.  Cranial Nerves:  II: PERRL. Visual fields full.   III, IV, VI: EOMI. Eyelids elevate symmetrically.  V: Sensation is intact to light touch and symmetrical to face.  VII: Face is symmetrical resting and smiling VIII: hearing intact to voice. IX, X: Palate elevates symmetrically. Phonation is normal.  XII: tongue is midline without fasciculations. Motor: Able to move all 4 extremities with good antigravity strength Tone: is normal and bulk is normal Sensation- Intact to light touch bilaterally.  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 ASPECTS 10.    CTA head & neck occlusion of the left MCA M1 segment with thrombus extending into at least 1 adjacent proximal M2 vessel MRI punctate acute infarct in the anterior/inferior aspect of the left external capsule, mild chronic microvascular ischemic changes MRA no LVO or hemodynamically significant stenosis 2D Echo EF 55 to 60%, severely dilated left atrium, mildly dilated right atrium, normal atrial septum LDL 60 HgbA1c 6.2 VTE prophylaxis -fully anticoagulated with Eliquis  aspirin 81 mg daily prior to admission, now on Eliquis  (apixaban ) daily  Therapy recommendations:  No follow up needed  Disposition: pending, likely home   Atrial fibrillation Home Meds: Diltiazem  120 mg daily, patient was on on anticoagulation due to low CHA2DS2-VASc score Continue telemetry monitoring Begin anticoagulation with Eliquis   Hypertension Home meds: None Stable Blood Pressure Goal: SBP 120-160 for first 24 hours then less than 180   Hyperlipidemia Home meds: Rosuvastatin  10 mg daily, resumed in hospital LDL 60, goal < 70 High intensity statin not indicated as LDL is below goal Continue statin at discharge  Other Stroke  Risk Factors Obesity, Body mass index is 44.2 kg/m., BMI >/= 30 associated with increased stroke risk, recommend weight loss, diet and exercise as appropriate    Other Active Problems None  Hospital day # 1  Patient seen by NP with MD, MD to edit note as needed. Cortney E Everitt Clint Kill , MSN, AGACNP-BC [facility] See Amion for schedule and pager information 04/26/2023 1:31 PM   STROKE MD NOTE :  I have personally obtained history,examined this patient, reviewed notes, independently viewed imaging studies, participated in medical decision making and plan of care.ROS completed by me personally and pertinent positives fully documented  I have made any additions or clarifications directly to the above note. Agree with note above.  Patient presented with sudden onset of expressive aphasia and right hemiparesis 2 [facility] and was given IV TNK and CT angiogram showed left distal M1 occlusion extending into proximal M2.  Patient was transferred for emergent mechanical thrombectomy which was performed emergently with a excellent revascularization.  Patient is clinically doing quite well and is obtained complete neurological recovery.  Continue close neurological observation and strict blood pressure control as per post TNK protocol.  Mobilize out of bed.  Therapy consults.  Continue ongoing stroke workup.  Check MRI scan of the brain later today.  Patient is agreeable to starting Eliquis  for anticoagulation for stroke prevention and will start later today.  Long discussion patient and wife at the bedside and answered questions. This patient is critically ill and at significant risk of neurological worsening, death and care requires constant monitoring of vital signs, hemodynamics,respiratory and cardiac monitoring, extensive review of multiple databases, frequent neurological assessment, discussion with family, other specialists and medical decision making of high complexity.I have made any additions  or clarifications directly to the above note.This critical care time does not reflect procedure time, or teaching time or supervisory time of PA/NP/Med Resident etc but could involve care discussion time.  I spent 30 minutes of neurocritical care time  in the care of  this patient.     Eather Popp, MD Medical Director [facility] Pager: [phone] 04/26/2023 2:43 PM   To contact Stroke Continuity provider, please refer to [email or web address]. After hours, contact General Neurology

## 2023-04-27 ENCOUNTER — Encounter: Payer: Self-pay | Admitting: Nurse Practitioner

## 2023-04-27 DIAGNOSIS — I639 Cerebral infarction, unspecified: Secondary | ICD-10-CM | POA: Diagnosis not present

## 2023-04-27 MED ORDER — APIXABAN 5 MG PO TABS: 5.0000 mg | ORAL_TABLET | Freq: Two times a day (BID) | ORAL | 2 refills | Status: DC

## 2023-05-05 DIAGNOSIS — Z8673 Personal history of transient ischemic attack (TIA), and cerebral infarction without residual deficits: Secondary | ICD-10-CM | POA: Diagnosis not present

## 2023-05-05 DIAGNOSIS — Z6841 Body Mass Index (BMI) 40.0 and over, adult: Secondary | ICD-10-CM | POA: Diagnosis not present

## 2023-05-05 DIAGNOSIS — I10 Essential (primary) hypertension: Secondary | ICD-10-CM | POA: Diagnosis not present

## 2023-05-05 DIAGNOSIS — R7303 Prediabetes: Secondary | ICD-10-CM | POA: Diagnosis not present

## 2023-05-05 DIAGNOSIS — I4891 Unspecified atrial fibrillation: Secondary | ICD-10-CM | POA: Diagnosis not present

## 2023-05-05 DIAGNOSIS — E66813 Obesity, class 3: Secondary | ICD-10-CM | POA: Diagnosis not present

## 2023-05-13 ENCOUNTER — Encounter: Payer: Self-pay | Admitting: Physician Assistant

## 2023-05-13 ENCOUNTER — Ambulatory Visit: Payer: 59 | Attending: Physician Assistant | Admitting: Physician Assistant

## 2023-05-13 VITALS — BP 146/96 | HR 64 | Ht 73.0 in | Wt 346.9 lb

## 2023-05-13 DIAGNOSIS — I10 Essential (primary) hypertension: Secondary | ICD-10-CM

## 2023-05-13 DIAGNOSIS — I340 Nonrheumatic mitral (valve) insufficiency: Secondary | ICD-10-CM

## 2023-05-13 DIAGNOSIS — E785 Hyperlipidemia, unspecified: Secondary | ICD-10-CM

## 2023-05-13 DIAGNOSIS — Z79899 Other long term (current) drug therapy: Secondary | ICD-10-CM | POA: Diagnosis not present

## 2023-05-13 DIAGNOSIS — I639 Cerebral infarction, unspecified: Secondary | ICD-10-CM | POA: Diagnosis not present

## 2023-05-13 DIAGNOSIS — I4819 Other persistent atrial fibrillation: Secondary | ICD-10-CM

## 2023-05-13 MED ORDER — LOSARTAN POTASSIUM 25 MG PO TABS: 25.0000 mg | ORAL_TABLET | Freq: Every day | ORAL | 3 refills | Status: DC

## 2023-05-13 NOTE — Patient Instructions (Addendum)
Medication Instructions:  Your physician recommends the following medication changes.  START TAKING: Losartan 25 mg daily  *If you need a refill on your cardiac medications before your next appointment, please call your pharmacy*   Lab Work: Your provider would like for you to return in 1 week to have the following labs drawn: CBC and BMeT.   Please go to [facility] [address] ([facility] [address], [place] [address] You do not need an appointment.  They are open from 8 am- 4:30 pm.  Lunch from 1:00 pm- 2:00 pm You DO NOT need to be fasting.   You may also go to one of the following [facility]:  [address] [place], [place] [address] Phone: [phone] Lab hours: Mon-Fri 8 am- 5 pm    Lunch 12 pm- 1 pm  [address] [place],  [place]  [address]  [place] Phone: [phone] Lab hours: 7 am- 4 pm Lunch 12 pm-1 pm   [address] [place],  [place]  [address]  [place] Phone: [phone] Lab hours: Mon-Fri 8 am- 5 pm    Lunch 12 pm- 1 pm  If you have labs (blood work) drawn today and your tests are completely normal, you will receive your results only by: MyChart Message (if you have MyChart) OR A paper copy in the mail If you have any lab test that is abnormal or we need to change your treatment, we will call you to review the results.   Follow-Up: At [facility], you and your health needs are our priority.  As part of our continuing mission to provide you with exceptional heart care, we have created designated Provider Care Teams.  These Care Teams include your primary Cardiologist (physician) and Advanced Practice Providers (APPs -  Physician Assistants and Nurse Practitioners) who all work together to provide you with the care you need, when you need it.   Your next appointment:   1 month(s)  Provider:   You may see Debbe Odea, MD or one of the following Advanced Practice Providers on your designated Care Team:   Eula Listen,  PA-C   Follow-up with Sherie Don, NP in 3-4 weeks

## 2023-05-13 NOTE — Progress Notes (Signed)
Cardiology Office Note    Date:  05/13/2023   ID:  Jonathon Evans., DOB 05-16-1960, MRN [id number]  PCP:  Mick Sell, MD  Cardiologist:  Debbe Odea, MD  Electrophysiologist:  Lanier Prude, MD   Chief Complaint: Hospital follow-up  History of Present Illness:   Jonathon Evans. is a 63 y.o. male with history of persistent A-fib, recent left MCA distribution CVA, HTN, HLD, and morbid obesity with sleep apnea on CPAP who presents for hospital follow-up.  He was initially evaluated in our office in 07/2022 after being found to have new onset A-fib postoperatively following left eye cataract surgery.  He remains in A-fib with controlled ventricular response.  At that time, given a CHA2DS2-VASc of 0 he was continued on aspirin and referred to the A-fib clinic.  Echo in 08/2022 showed an EF of 55 to 60%, no regional wall motion abnormalities, mildly reduced RV systolic function with mildly enlarged ventricular cavity size, moderate biatrial enlargement, mild to moderate mitral regurgitation, borderline dilatation of the aortic root measuring 42 mm, and borderline dilatation of the aortic arch measuring 36 mm.  He was evaluated by EP in 08/2022 and remained in A-fib.  He was not felt to be a candidate for invasive EP procedures given obesity and was continued on aspirin.  He was most recently seen in the office in 03/2023 and was without symptoms of angina or cardiac decompensation.  He was admitted to [facility] from 1/13 through 04/27/2023 after presenting to his job noting difficulty gripping his stools with associated speech deficit.  He was found to have an acute ischemic infarct involving distal left M1 this post mechanical thrombectomy TNK.  Etiology felt to be embolic in the setting of A-fib without anticoagulation.  Echo showed an EF of 55 to 60%, no regional wall motion abnormalities, normal RV systolic function, mildly enlarged ventricular cavity size, normal RVSP, severely  dilated left atrium, mildly dilated right atrium, trivial mitral regurgitation, calcified aortic valve without evidence of stenosis, and an estimated right atrial pressure of 3 mmHg.  At time of discharge, aspirin was discontinued and he was placed on apixaban.  He comes in accompanied by his wife today and is doing well from a cardiac perspective.  He is without symptoms of angina or cardiac decompensation.  At times, he does feel like there is still some difficulty in getting some words out.  No residual extremity deficit.  Remains adherent and tolerating apixaban, taking twice daily.  No falls, hematochezia, or melena.  Not currently checking blood pressure at home.  Mild ankle swelling stable.  Has appointment with neurology in early March.  At this time, it is currently unclear exactly how long he has been in A-fib given lack of symptoms and noted severely dilated left atrium on echo.  Nonetheless, he and his wife are interested in pursuing a rhythm control strategy.   Labs independently reviewed: 04/2023 - TC 150, TG 177, HDL 55, LDL 60, A1c 6.2, Hgb 16.1, PLT 187, potassium 4.3, BUN 18, serum creatinine 1.22, albumin 4.2, AST/ALT normal 07/2022 - TSH normal  Past Medical History:  Diagnosis Date   Arthritis    Right shoulder   GERD (gastroesophageal reflux disease)    Hyperlipidemia    Pre-diabetes    Sleep apnea    CPAP    Past Surgical History:  Procedure Laterality Date   CATARACT EXTRACTION W/PHACO Right 02/12/2019   Procedure: CATARACT EXTRACTION PHACO AND INTRAOCULAR LENS PLACEMENT (  IOC) RIGHT 00:44.3            13.1%          5.86;  Surgeon: Nevada Crane, MD;  Location: [facility];  Service: Ophthalmology;  Laterality: Right;  sleep apnea   CATARACT EXTRACTION W/PHACO Left 07/19/2022   Procedure: CATARACT EXTRACTION PHACO AND INTRAOCULAR LENS PLACEMENT (IOC) LEFT  2.50  00:20.0;  Surgeon: Nevada Crane, MD;  Location: [facility];  Service: Ophthalmology;   Laterality: Left;   colonoscopy and upper endoscopy     COLONOSCOPY WITH PROPOFOL N/A 02/16/2016   Procedure: COLONOSCOPY WITH PROPOFOL;  Surgeon: Christena Deem, MD;  Location: [facility];  Service: Endoscopy;  Laterality: N/A;   IR CT HEAD LTD  04/25/2023   IR PERCUTANEOUS ART THROMBECTOMY/INFUSION INTRACRANIAL INC DIAG ANGIO  04/25/2023   RADIOLOGY WITH ANESTHESIA N/A 04/25/2023   Procedure: RADIOLOGY WITH ANESTHESIA;  Surgeon: Radiologist, Medication, MD;  Location: [facility] OR;  Service: Radiology;  Laterality: N/A;    Current Medications: Current Meds  Medication Sig   apixaban (ELIQUIS) 5 MG TABS tablet Take 1 tablet (5 mg total) by mouth 2 (two) times daily.   diltiazem (CARDIZEM CD) 120 MG 24 hr capsule Take 1 capsule (120 mg total) by mouth daily.   losartan (COZAAR) 25 MG tablet Take 1 tablet (25 mg total) by mouth daily.   Multiple Vitamin (MULTIVITAMIN) tablet Take 1 tablet by mouth daily.   Omega-3 Fatty Acids (FISH OIL PO) Take 1 capsule by mouth daily.   omeprazole (PRILOSEC) 20 MG capsule Take 1 capsule (20 mg total) by mouth daily.   rosuvastatin (CRESTOR) 10 MG tablet Take 10 mg by mouth daily.   sildenafil (VIAGRA) 100 MG tablet Take 100 mg by mouth daily as needed for erectile dysfunction.    Allergies:   Patient has no known allergies.   Social History   Socioeconomic History   Marital status: Married    Spouse name: Not on file   Number of children: Not on file   Years of education: Not on file   Highest education level: Not on file  Occupational History   Not on file  Tobacco Use   Smoking status: Never   Smokeless tobacco: Never  Vaping Use   Vaping status: Never Used  Substance and Sexual Activity   Alcohol use: No    Comment: rare - Holidays   Drug use: No   Sexual activity: Not on file  Other Topics Concern   Not on file  Social History Narrative   Not on file   Social Drivers of Health   Financial Resource Strain: Low Risk  (05/05/2023)    Received from [facility]   Overall Financial Resource Strain (CARDIA)    Difficulty of Paying Living Expenses: Not hard at all  Food Insecurity: No Food Insecurity (05/05/2023)   Received from [facility]   Hunger Vital Sign    Worried About Running Out of Food in the Last Year: Never true    Ran Out of Food in the Last Year: Never true  Transportation Needs: No Transportation Needs (05/05/2023)   Received from [facility]    In the past 12 months, has lack of transportation kept you from medical appointments or from getting medications?: No    Lack of Transportation (Non-Medical): No  Physical Activity: Not on file  Stress: Not on file  Social Connections: Not on file  Family History:  The patient's family history is not on file.  ROS:   12-point review of systems is negative unless otherwise noted in the HPI.   EKGs/Labs/Other Studies Reviewed:    Studies reviewed were summarized above. The additional studies were reviewed today:  2D echo 08/26/2022: 1. Left ventricular ejection fraction, by estimation, is 55 to 60%. The  left ventricle has normal function. The left ventricle has no regional  wall motion abnormalities. Left ventricular diastolic parameters are  indeterminate.   2. Right ventricular systolic function is mildly reduced. The right  ventricular size is mildly enlarged. Tricuspid regurgitation signal is  inadequate for assessing PA pressure.   3. Left atrial size was moderately dilated.   4. Right atrial size was moderately dilated.   5. The mitral valve is normal in structure. Mild to moderate mitral valve  regurgitation. No evidence of mitral stenosis.   6. The aortic valve is normal in structure. Aortic valve regurgitation is  not visualized. No aortic stenosis is present.   7. There is borderline dilatation of the aortic root, measuring 39 mm.  There is mild dilatation of  the ascending aorta, measuring 42 mm. There is  borderline dilatation of the aortic arch, measuring 36 mm.   8. The inferior vena cava is normal in size with greater than 50%  respiratory variability, suggesting right atrial pressure of 3 mmHg.  __________  2D echo 04/25/2023: 1. Left ventricular ejection fraction, by estimation, is 55 to 60%. The  left ventricle has normal function. The left ventricle has no regional  wall motion abnormalities. Left ventricular diastolic function could not  be evaluated.   2. Right ventricular systolic function is normal. The right ventricular  size is mildly enlarged. There is normal pulmonary artery systolic  pressure. The estimated right ventricular systolic pressure is 14.2 mmHg.   3. Left atrial size was severely dilated.   4. Right atrial size was mildly dilated.   5. The mitral valve is grossly normal. Trivial mitral valve  regurgitation. No evidence of mitral stenosis.   6. The aortic valve is tricuspid. There is mild calcification of the  aortic valve. Aortic valve regurgitation is not visualized. No aortic  stenosis is present.   7. The inferior vena cava is normal in size with greater than 50%  respiratory variability, suggesting right atrial pressure of 3 mmHg.   Comparison(s): No significant change from prior study.    EKG:  EKG is ordered today.  The EKG ordered today demonstrates A-fib, 64 bpm, RBBB, consistent with prior tracing  Recent Labs: 04/25/2023: ALT 17; BUN 18; Creatinine, Ser 1.22; Hemoglobin 16.1; Platelets 187; Potassium 4.3; Sodium 140  Recent Lipid Panel    Component Value Date/Time   CHOL 150 04/26/2023 0524   TRIG 177 (H) 04/26/2023 0524   HDL 55 04/26/2023 0524   CHOLHDL 2.7 04/26/2023 0524   VLDL 35 04/26/2023 0524   LDLCALC 60 04/26/2023 0524    PHYSICAL EXAM:    VS:  BP (!) 146/96 (BP Location: Left Arm, Patient Position: Sitting, Cuff Size: Normal)   Pulse 64   Ht 6\' 1"  (1.854 m)   Wt (!) 346 lb 14.4  oz (157.4 kg)   SpO2 97%   BMI 45.77 kg/m   BMI: Body mass index is 45.77 kg/m.  Physical Exam Vitals reviewed.  Constitutional:      Appearance: He is well-developed.  HENT:     Head: Normocephalic and atraumatic.  Eyes:  General:        Right eye: No discharge.        Left eye: No discharge.  Cardiovascular:     Rate and Rhythm: Normal rate. Rhythm irregularly irregular.     Heart sounds: Normal heart sounds, S1 normal and S2 normal. Heart sounds not distant. No midsystolic click and no opening snap. No murmur heard.    No friction rub.  Pulmonary:     Effort: Pulmonary effort is normal. No respiratory distress.     Breath sounds: Normal breath sounds. No decreased breath sounds, wheezing, rhonchi or rales.  Chest:     Chest wall: No tenderness.  Abdominal:     General: There is no distension.  Musculoskeletal:     Cervical back: Normal range of motion.  Skin:    General: Skin is warm and dry.     Nails: There is no clubbing.  Neurological:     Mental Status: He is alert and oriented to person, place, and time.  Psychiatric:        Speech: Speech normal.        Behavior: Behavior normal.        Thought Content: Thought content normal.        Judgment: Judgment normal.     Wt Readings from Last 3 Encounters:  05/13/23 (!) 346 lb 14.4 oz (157.4 kg)  04/25/23 (!) 335 lb (152 kg)  03/21/23 (!) 349 lb (158.3 kg)     ASSESSMENT & PLAN:   Persistent A-fib: Remains in A-fib with controlled ventricular response and is asymptomatic.  Evaluated by EP and not felt to be a candidate for invasive EP procedures.  CHA2DS2-VASc at least 3 (HTN, CVA x 2).  Now on apixaban 5 mg twice daily and does not meet reduced dosing criteria.  Remains on diltiazem 120 mg daily.  Will have patient evaluated by EP for consideration of AAD (severely dilated left atrium) and potential cardioversion in an effort to try and restore sinus rhythm given his age.  This attempt may overall be  unsuccessful, though should be considered.  Recent left MCA distribution CVA: Felt to be in the setting of A-fib not on anticoagulation.  Now on apixaban as outlined above as well as rosuvastatin.  Has follow-up scheduled with neurology.  Mitral regurgitation: Mild to moderate by recent echo.  Monitor periodically.  HTN: Blood pressure is elevated in the office today.  Start losartan 25 mg daily.  Follow-up BMP in 1 week.  Low-sodium diet is encouraged.  HLD: LDL 60.  Remains on rosuvastatin 10 mg.  Obesity: Has previously visited with a dietitian.  Indicates overeating is a significant issue for him.  Heart healthy diet and regular exercise encouraged.  Discussed portion control briefly.     Disposition: F/u with Dr. Azucena Cecil or an APP in 2-3 months, and Jonathon Don, NP in 3-4 weeks.   Medication Adjustments/Labs and Tests Ordered: Current medicines are reviewed at length with the patient today.  Concerns regarding medicines are outlined above. Medication changes, Labs and Tests ordered today are summarized above and listed in the Patient Instructions accessible in Encounters.   Signed, Eula Listen, PA-C 05/13/2023 3:23 PM     [facility] -  [address] [place], [place] [address] [phone]

## 2023-05-15 DIAGNOSIS — G4733 Obstructive sleep apnea (adult) (pediatric): Secondary | ICD-10-CM | POA: Diagnosis not present

## 2023-05-24 DIAGNOSIS — I4819 Other persistent atrial fibrillation: Secondary | ICD-10-CM | POA: Diagnosis not present

## 2023-05-24 DIAGNOSIS — Z79899 Other long term (current) drug therapy: Secondary | ICD-10-CM | POA: Diagnosis not present

## 2023-05-25 LAB — BASIC METABOLIC PANEL
BUN/Creatinine Ratio: 13 (ref 10–24)
BUN: 15 mg/dL (ref 8–27)
CO2: 23 mmol/L (ref 20–29)
Calcium: 9.8 mg/dL (ref 8.6–10.2)
Chloride: 103 mmol/L (ref 96–106)
Creatinine, Ser: 1.18 mg/dL (ref 0.76–1.27)
Glucose: 85 mg/dL (ref 70–99)
Potassium: 4.6 mmol/L (ref 3.5–5.2)
Sodium: 143 mmol/L (ref 134–144)
eGFR: 69 mL/min/{1.73_m2} (ref >59)

## 2023-05-25 LAB — CBC
Hematocrit: 46.2 % (ref 37.5–51.0)
Hemoglobin: 15.6 g/dL (ref 13.0–17.7)
MCH: 29.1 pg (ref 26.6–33.0)
MCHC: 33.8 g/dL (ref 31.5–35.7)
MCV: 86 fL (ref 79–97)
Platelets: 172 10*3/uL (ref 150–450)
RBC: 5.37 x10E6/uL (ref 4.14–5.80)
RDW: 13.5 % (ref 11.6–15.4)
WBC: 5.7 10*3/uL (ref 3.4–10.8)

## 2023-06-01 NOTE — Progress Notes (Unsigned)
 Electrophysiology Clinic Note    Date:  06/03/2023  Patient ID:  Jonathon Mitrano., DOB [birth year], MRN [id number] PCP:  Mick Sell, MD  Cardiologist:  Debbe Odea, MD Electrophysiologist: Lanier Prude, MD   Discussed the use of AI scribe software for clinical note transcription with the patient, who gave verbal consent to proceed.   Patient Profile    Chief Complaint: Afib follow-up  History of Present Illness: Jonathon Andon Villard. is a 63 y.o. male with PMH notable for persis AFib, CVA, HTN, OSA on CPAP; seen today for Lanier Prude, MD for routine electrophysiology followup.   He was last seen by Dr. Lalla Brothers 08/2022 for consult of AFib. Chadsvasc was low at 0-1, so 81mg  ASA continued. Not ablation candidate d/t BMI. He was admitted 04/2023 for a CVA, thought embolic iso AFib. He was discharged on eliquis  He saw PA Dunn 04/2023 where he was continuing to heal from CVA, interested in rhythm control strategy.  On follow-up today, he denies chest pain, chest pressure, SOB.  Wife joins for visit and has been checking his BP regularly, most readings 100-110s systolic. He was recently started on losartan along with diltiazem.  He does have lower extremity edema that is stable. 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 He is trying to make better food choices and walk more regularly for exercise.  Diligently takes eliquis BID, no missed doses, no bleeding concerns.     AAD History: None     ROS:  Please see the history of present illness. All other systems are reviewed and otherwise negative.    Physical Exam    VS:  BP 130/72 (BP Location: Left Arm, Patient Position: Sitting, Cuff Size: Large)   Pulse 75   Ht 6\' 1"  (1.854 m)   Wt (!) 344 lb 2 oz (156.1 kg)   SpO2 98%   BMI 45.40 kg/m  BMI: Body mass index is 45.4 kg/m.  Wt Readings from Last 3 Encounters:  06/03/23 (!) 344 lb 2 oz (156.1 kg)  05/13/23 (!) 346 lb 14.4 oz (157.4 kg)   04/25/23 (!) 335 lb (152 kg)     GEN- The patient is well appearing, alert and oriented x 3 today.   Lungs- Clear to ausculation bilaterally, normal work of breathing.  Heart- Regular rate and rhythm, no murmurs, rubs or gallops Extremities- 1+ peripheral edema, warm, dry    Studies Reviewed   Previous EP, cardiology notes.    EKG is not ordered. Personal review of EKG from  05/13/23  shows:  AFib at 64. LAD, RBBB QRS QT ; QTC corrected for RBBB =        TTE, 04/25/2023  1. Left ventricular ejection fraction, by estimation, is 55 to 60%. The left ventricle has normal function. The left ventricle has no regional wall motion abnormalities. Left ventricular diastolic function could not be evaluated.   2. Right ventricular systolic function is normal. The right ventricular size is mildly enlarged. There is normal pulmonary artery systolic pressure. The estimated right ventricular systolic pressure is 14.2 mmHg.   3. Left atrial size was severely dilated.   4. Right atrial size was mildly dilated.   5. The mitral valve is grossly normal. Trivial mitral valve regurgitation. No evidence of mitral stenosis.   6. The aortic valve is tricuspid. There is mild calcification of the aortic valve. Aortic valve regurgitation is not visualized. No aortic stenosis is present.  7. The inferior vena cava is normal in size with greater than 50% respiratory variability, suggesting right atrial pressure of 3 mmHg.   Comparison(s): No significant change from prior study.   TTE, 08/26/2022  1. Left ventricular ejection fraction, by estimation, is 55 to 60%. The left ventricle has normal function. The left ventricle has no regional wall motion abnormalities. Left ventricular diastolic parameters are indeterminate.   2. Right ventricular systolic function is mildly reduced. The right ventricular size is mildly enlarged. Tricuspid regurgitation signal is inadequate for assessing PA pressure.    3. Left atrial size was moderately dilated.   4. Right atrial size was moderately dilated.   5. The mitral valve is normal in structure. Mild to moderate mitral valve regurgitation. No evidence of mitral stenosis.   6. The aortic valve is normal in structure. Aortic valve regurgitation is not visualized. No aortic stenosis is present.   7. There is borderline dilatation of the aortic root, measuring 39 mm. There is mild dilatation of the ascending aorta, measuring 42 mm. There is  borderline dilatation of the aortic arch, measuring 36 mm.   8. The inferior vena cava is normal in size with greater than 50% respiratory variability, suggesting right atrial pressure of 3 mmHg.    Assessment and Plan     #) persis AFib No overt s/s of arrhythmia Not ablation candidate d/t BMI Discussed AAD option, prefer tikosyn with patient's young age. We reviewed the tikosyn loading process and customary post-loading follow-up schedule He will think about this and notify office if he wishes to proceed Continue 120mg  diltiazem daily  #) Hypercoag d/t persis afib CHA2DS2-VASc Score = at least 3 [CHF History: 0, HTN History: 1, Diabetes History: 0, Stroke History: 2, Vascular Disease History: 0, Age Score: 0, Gender Score: 0].  Therefore, the patient's annual risk of stroke is 3.2 %.    Stroke ppx - 5mg  eliquis BID, appropriately dosed No bleeding concerns  #) HTN BP well-controlled by home readings Continue 120mg  dilt as above Continue 25mg  losartan  #) lifestyle recommendations Recommended low-salt diet and regular physical activity      Current medicines are reviewed at length with the patient today.   The patient does not have concerns regarding his medicines.  The following changes were made today:  none  Labs/ tests ordered today include:  No orders of the defined types were placed in this encounter.    Disposition: Follow up with Dr. Lalla Brothers or EP APP in 12 months, sooner if he  decides to pursue tikosyn loading    I spent 35 minutes caring for this patient today including face-to-face counseling, review of previous cardiology notes and imaging results, lab work, and coordinating future appointments with general cardiology.    Signed, Sherie Don, NP  06/03/23  10:25 AM  Electrophysiology [facility]

## 2023-06-03 ENCOUNTER — Encounter: Payer: Self-pay | Admitting: Cardiology

## 2023-06-03 ENCOUNTER — Ambulatory Visit: Payer: 59 | Attending: Cardiology | Admitting: Cardiology

## 2023-06-03 VITALS — BP 130/72 | HR 75 | Ht 73.0 in | Wt 344.1 lb

## 2023-06-03 DIAGNOSIS — D6869 Other thrombophilia: Secondary | ICD-10-CM | POA: Diagnosis not present

## 2023-06-03 DIAGNOSIS — I10 Essential (primary) hypertension: Secondary | ICD-10-CM | POA: Diagnosis not present

## 2023-06-03 DIAGNOSIS — I4819 Other persistent atrial fibrillation: Secondary | ICD-10-CM | POA: Diagnosis not present

## 2023-06-03 NOTE — Patient Instructions (Signed)
 Medication Instructions:  The current medical regimen is effective;  continue present plan and medications.   *If you need a refill on your cardiac medications before your next appointment, please call your pharmacy*   Follow-Up: At [facility], you and your health needs are our priority.  As part of our continuing mission to provide you with exceptional heart care, we have created designated Provider Care Teams.  These Care Teams include your primary Cardiologist (physician) and Advanced Practice Providers (APPs -  Physician Assistants and Nurse Practitioners) who all work together to provide you with the care you need, when you need it.  We recommend signing up for the patient portal called "MyChart".  Sign up information is provided on this After Visit Summary.  MyChart is used to connect with patients for Virtual Visits (Telemedicine).  Patients are able to view lab/test results, encounter notes, upcoming appointments, etc.  Non-urgent messages can be sent to your provider as well.   To learn more about what you can do with MyChart, go to [email or web address].    Your next appointment:   12 month(s)  Provider:   Steffanie Dunn, MD or Sherie Don, NP

## 2023-06-07 ENCOUNTER — Ambulatory Visit: Payer: 59 | Admitting: Physician Assistant

## 2023-06-12 DIAGNOSIS — G4733 Obstructive sleep apnea (adult) (pediatric): Secondary | ICD-10-CM | POA: Diagnosis not present

## 2023-06-14 ENCOUNTER — Ambulatory Visit (INDEPENDENT_AMBULATORY_CARE_PROVIDER_SITE_OTHER): Payer: 59 | Admitting: Diagnostic Neuroimaging

## 2023-06-14 ENCOUNTER — Encounter: Payer: Self-pay | Admitting: Diagnostic Neuroimaging

## 2023-06-14 VITALS — BP 142/84 | HR 57 | Ht 73.0 in | Wt 345.0 lb

## 2023-06-14 DIAGNOSIS — E785 Hyperlipidemia, unspecified: Secondary | ICD-10-CM

## 2023-06-14 DIAGNOSIS — I480 Paroxysmal atrial fibrillation: Secondary | ICD-10-CM

## 2023-06-14 DIAGNOSIS — I10 Essential (primary) hypertension: Secondary | ICD-10-CM

## 2023-06-14 DIAGNOSIS — G4733 Obstructive sleep apnea (adult) (pediatric): Secondary | ICD-10-CM | POA: Diagnosis not present

## 2023-06-14 DIAGNOSIS — I63412 Cerebral infarction due to embolism of left middle cerebral artery: Secondary | ICD-10-CM | POA: Diagnosis not present

## 2023-06-14 NOTE — Progress Notes (Signed)
 [facility]  PATIENT: Jonathon Evans. DOB: 06-13-1960  REFERRING CLINICIAN: Mick Sell, MD HISTORY FROM: patient and spouse REASON FOR VISIT: new consult   HISTORICAL  CHIEF COMPLAINT:  Chief Complaint  Patient presents with   Cerebrovascular Accident    Rm 7 with spouse  Pt is well, reports he gets off balance when his eyes are closed but otherwise he is stable, no other concerns.     HISTORY OF PRESENT ILLNESS:   63 year old male here for hospital stroke follow-up.  History of atrial fibrillation, hypertension, hyperlipidemia, presented to hospital in January 2025 due to onset of aphasia and right arm and right leg weakness.  He went to the [facility] hospital and received tenecteplase; transferred to [facility] for mechanical thrombectomy with complete revascularization of occluded left M1 segment.  MRI showed minimal punctate infarct in the left external capsule.  Patient's symptoms greatly improved.  Overall he is doing well.  Tolerating medications.   REVIEW OF SYSTEMS: Full 14 system review of systems performed and negative with exception of: as per HPI.  ALLERGIES: No Known Allergies  HOME MEDICATIONS: Outpatient Medications Prior to Visit  Medication Sig Dispense Refill   apixaban (ELIQUIS) 5 MG TABS tablet Take 1 tablet (5 mg total) by mouth 2 (two) times daily. 60 tablet 2   diltiazem (CARDIZEM CD) 120 MG 24 hr capsule Take 1 capsule (120 mg total) by mouth daily. 90 capsule 3   losartan (COZAAR) 25 MG tablet Take 1 tablet (25 mg total) by mouth daily. 90 tablet 3   Multiple Vitamin (MULTIVITAMIN) tablet Take 1 tablet by mouth daily.     Omega-3 Fatty Acids (FISH OIL PO) Take 1 capsule by mouth daily.     omeprazole (PRILOSEC) 20 MG capsule Take 1 capsule (20 mg total) by mouth daily. 90 capsule 1   rosuvastatin (CRESTOR) 10 MG tablet Take 10 mg by mouth daily.     sildenafil (VIAGRA) 100 MG tablet Take 100 mg by mouth daily as  needed for erectile dysfunction.     No facility-administered medications prior to visit.      PHYSICAL EXAM  GENERAL EXAM/CONSTITUTIONAL: Vitals:  Vitals:   06/14/23 0926 06/14/23 0929  BP: (!) 150/92 (!) 142/84  Pulse: (!) 55 (!) 57  Weight: (!) 345 lb (156.5 kg)   Height: 6\' 1"  (1.854 m)    Body mass index is 45.52 kg/m. Wt Readings from Last 3 Encounters:  06/14/23 (!) 345 lb (156.5 kg)  06/03/23 (!) 344 lb 2 oz (156.1 kg)  05/13/23 (!) 346 lb 14.4 oz (157.4 kg)   Patient is in no distress; well developed, nourished and groomed; neck is supple  CARDIOVASCULAR: Examination of carotid arteries is normal; no carotid bruits Regular rate and rhythm, no murmurs Examination of peripheral vascular system by observation and palpation is normal  EYES: Ophthalmoscopic exam of optic discs and posterior segments is normal; no papilledema or hemorrhages No results found.  MUSCULOSKELETAL: Gait, strength, tone, movements noted in Neurologic exam below  NEUROLOGIC: MENTAL STATUS:      No data to display         awake, alert, oriented to person, place and time recent and remote memory intact normal attention and concentration language fluent, comprehension intact, naming intact fund of knowledge appropriate  CRANIAL NERVE:  2nd - no papilledema on fundoscopic exam 2nd, 3rd, 4th, 6th - pupils equal and reactive to light, visual fields full to confrontation, extraocular muscles intact, no  nystagmus 5th - facial sensation symmetric 7th - facial strength symmetric 8th - hearing intact 9th - palate elevates symmetrically, uvula midline 11th - shoulder shrug symmetric 12th - tongue protrusion midline  MOTOR:  normal bulk and tone, full strength in the BUE, BLE  SENSORY:  normal and symmetric to light touch, temperature, vibration  COORDINATION:  finger-nose-finger, fine finger movements normal  REFLEXES:  deep tendon reflexes 1+ and symmetric  GAIT/STATION:   narrow based gait     DIAGNOSTIC DATA (LABS, IMAGING, TESTING) - I reviewed patient records, labs, notes, testing and imaging myself where available.  Lab Results  Component Value Date   WBC 5.7 05/24/2023   HGB 15.6 05/24/2023   HCT 46.2 05/24/2023   MCV 86 05/24/2023   PLT 172 05/24/2023      Component Value Date/Time   NA 143 05/24/2023 1454   K 4.6 05/24/2023 1454   CL 103 05/24/2023 1454   CO2 23 05/24/2023 1454   GLUCOSE 85 05/24/2023 1454   GLUCOSE 126 (H) 04/25/2023 0943   BUN 15 05/24/2023 1454   CREATININE 1.18 05/24/2023 1454   CALCIUM 9.8 05/24/2023 1454   PROT 7.6 04/25/2023 0943   ALBUMIN 4.2 04/25/2023 0943   AST 25 04/25/2023 0943   ALT 17 04/25/2023 0943   ALKPHOS 55 04/25/2023 0943   BILITOT 1.2 04/25/2023 0943   GFRNONAA >60 04/25/2023 0943   Lab Results  Component Value Date   CHOL 150 04/26/2023   HDL 55 04/26/2023   LDLCALC 60 04/26/2023   TRIG 177 (H) 04/26/2023   CHOLHDL 2.7 04/26/2023   Lab Results  Component Value Date   HGBA1C 6.2 (H) 04/25/2023   No results found for: "VITAMINB12" No results found for: "TSH"    ASSESSMENT AND PLAN  63 y.o. year old male here with:   Dx:  1. Cerebrovascular accident (CVA) due to embolism of left middle cerebral artery (HCC)   2. Paroxysmal atrial fibrillation (HCC)   3. Primary hypertension   4. Hyperlipidemia, unspecified hyperlipidemia type   5. OSA on CPAP     PLAN:   left internal capsule punctate infarct s/p mechanical thrombectomy and TNK Etiology: Likely embolic in the setting of atrial fibrillation without anticoagulation Code Stroke CT head No acute abnormality. ASPECTS 10.    CTA head & neck occlusion of the left MCA M1 segment with thrombus extending into at least 1 adjacent proximal M2 vessel MRI punctate acute infarct in the anterior/inferior aspect of the left external capsule, mild chronic microvascular ischemic changes MRA no LVO or hemodynamically significant  stenosis 2D Echo EF 55 to 60%, severely dilated left atrium, mildly dilated right atrium, normal atrial septum LDL 60 HgbA1c 6.2 VTE prophylaxis -fully anticoagulated with Eliquis aspirin 81 mg daily prior to admission, now on Eliquis (apixaban) daily     Atrial fibrillation Home Meds: Diltiazem 120 mg daily, patient was on on anticoagulation due to low CHA2DS2-VASc score Continue telemetry monitoring Begin anticoagulation with Eliquis   Hypertension Home meds: None Stable Blood Pressure Goal: SBP 120-160 for first 24 hours then less than 180    Hyperlipidemia Home meds: Rosuvastatin 10 mg daily, resumed in hospital LDL 60, goal < 70 High intensity statin not indicated as LDL is below goal Continue statin at discharge   Other Stroke Risk Factors Obesity, Body mass index is 44.2 kg/m., BMI >/= 30 associated with increased stroke risk, recommend weight loss, diet and exercise as appropriate   Return for return to PCP, pending  if symptoms worsen or fail to improve.    Suanne Marker, MD 06/14/2023, 5:43 PM Certified in Neurology, Neurophysiology and Neuroimaging  [facility] [address] [place], [place] [address] [phone]

## 2023-06-21 DIAGNOSIS — B351 Tinea unguium: Secondary | ICD-10-CM | POA: Diagnosis not present

## 2023-06-21 DIAGNOSIS — L603 Nail dystrophy: Secondary | ICD-10-CM | POA: Diagnosis not present

## 2023-07-08 ENCOUNTER — Ambulatory Visit: Admitting: Cardiology

## 2023-07-11 DIAGNOSIS — G4733 Obstructive sleep apnea (adult) (pediatric): Secondary | ICD-10-CM | POA: Diagnosis not present

## 2023-07-11 NOTE — H&P (View-Only) (Signed)
 Electrophysiology Clinic Note    Date:  07/12/2023  Patient ID:  Jonathon Bienvenue., DOB 04/13/1960, MRN [id number] PCP:  Mick Sell, MD  Cardiologist:  Debbe Odea, MD Electrophysiologist: Lanier Prude, MD   Discussed the use of AI scribe software for clinical note transcription with the patient, who gave verbal consent to proceed.   Patient Profile    Chief Complaint: Afib follow-up  History of Present Illness: Jonathon Guerry Covington. is a 63 y.o. male with PMH notable for persis AFib, CVA, HTN, OSA on CPAP; seen today for Lanier Prude, MD for routine electrophysiology followup.   He was last seen by Dr. Lalla Brothers 08/2022 for consult of AFib. Chadsvasc was low at 0-1, so 81mg  ASA continued. Not ablation candidate d/t BMI.  He was admitted 04/2023 for a CVA, thought embolic iso AFib. He was discharged on eliquis  He saw PA Dunn 04/2023 where he was continuing to heal from CVA, interested in rhythm control strategy. I saw him 05/2023, recommended tikosyn for AAD.   On follow-up today, patient has several questions about tikosyn loading admission, routine follow-up, and potential side effects of the medication. He denies chest pain, chest pressure, palpitations, SOB. He does have reduced energy at times, unsure whether this is related to AFib or other factors.   Continues to take eliquis BID, no bleeding concerns. He has no further follow-up with neurology.   His wife joins for appt whom I have met before, and also has several questions.   AAD History: None     ROS:  Please see the history of present illness. All other systems are reviewed and otherwise negative.    Physical Exam    VS:  There were no vitals taken for this visit. BMI: There is no height or weight on file to calculate BMI.  Wt Readings from Last 3 Encounters:  06/14/23 (!) 345 lb (156.5 kg)  06/03/23 (!) 344 lb 2 oz (156.1 kg)  05/13/23 (!) 346 lb 14.4 oz (157.4 kg)     GEN- The patient is  well appearing, alert and oriented x 3 today.   Lungs- Clear to ausculation bilaterally, normal work of breathing.  Heart- Irregularly irregular rate and rhythm, no murmurs, rubs or gallops Extremities- 1+ peripheral edema, warm, dry    Studies Reviewed   Previous EP, cardiology notes.    EKG is ordered. Personal review of EKG from today shows   EKG Interpretation Date/Time:  Tuesday July 12 2023 14:29:23 EDT Ventricular Rate:  64 PR Interval:    QRS Duration:  150 QT Interval:  432 QTC Calculation: 445 R Axis:   -60  Text Interpretation: Atrial fibrillation Right bundle branch block Left anterior fascicular block Bifascicular block Confirmed by Sherie Don [phone]) on 07/12/2023 2:32:54 PM    05/13/2023 AFib at 64. LAD, RBBB QRS QT ; QTC corrected for RBBB =   TTE, 04/25/2023  1. Left ventricular ejection fraction, by estimation, is 55 to 60%. The left ventricle has normal function. The left ventricle has no regional wall motion abnormalities. Left ventricular diastolic function could not be evaluated.   2. Right ventricular systolic function is normal. The right ventricular size is mildly enlarged. There is normal pulmonary artery systolic pressure. The estimated right ventricular systolic pressure is 14.2 mmHg.   3. Left atrial size was severely dilated.   4. Right atrial size was mildly dilated.   5. The mitral valve is grossly normal. Trivial  mitral valve regurgitation. No evidence of mitral stenosis.   6. The aortic valve is tricuspid. There is mild calcification of the aortic valve. Aortic valve regurgitation is not visualized. No aortic stenosis is present.   7. The inferior vena cava is normal in size with greater than 50% respiratory variability, suggesting right atrial pressure of 3 mmHg.   Comparison(s): No significant change from prior study.   TTE, 08/26/2022  1. Left ventricular ejection fraction, by estimation, is 55 to 60%. The left ventricle  has normal function. The left ventricle has no regional wall motion abnormalities. Left ventricular diastolic parameters are indeterminate.   2. Right ventricular systolic function is mildly reduced. The right ventricular size is mildly enlarged. Tricuspid regurgitation signal is inadequate for assessing PA pressure.   3. Left atrial size was moderately dilated.   4. Right atrial size was moderately dilated.   5. The mitral valve is normal in structure. Mild to moderate mitral valve regurgitation. No evidence of mitral stenosis.   6. The aortic valve is normal in structure. Aortic valve regurgitation is not visualized. No aortic stenosis is present.   7. There is borderline dilatation of the aortic root, measuring 39 mm. There is mild dilatation of the ascending aorta, measuring 42 mm. There is  borderline dilatation of the aortic arch, measuring 36 mm.   8. The inferior vena cava is normal in size with greater than 50% respiratory variability, suggesting right atrial pressure of 3 mmHg.    Assessment and Plan     #) persis AFib No overt s/s of arrhythmia Not ablation candidate d/t BMI Discussed AAD option - do not favor amio d/t young age, do not favor class I-C d/t RBBB on EKG We reviewed tikosyn vs multaq. Test claim for multaq is $0. He prefers to trial multaq at this time given it can be initiated in the outpatient setting, which I think is reasonable Start multaq 400mg  BID Continue 120mg  diltiazem daily Close follow-up in 2 weeks. If remains in afib, will proceed with DCCV  #) Hypercoag d/t persis afib #) h/o CVA CHA2DS2-VASc Score = at least 3 [CHF History: 0, HTN History: 1, Diabetes History: 0, Stroke History: 2, Vascular Disease History: 0, Age Score: 0, Gender Score: 0].  Therefore, the patient's annual risk of stroke is 3.2 %.    Stroke ppx - 5mg  eliquis BID, appropriately dosed No bleeding concerns       Current medicines are reviewed at length with the patient today.    The patient does not have concerns regarding his medicines.  The following changes were made today:   START 400mg  multaq in AM and PM  Labs/ tests ordered today include:  Orders Placed This Encounter  Procedures   EKG 12-Lead     Disposition: Follow up with EP APP  in 2 weeks    I spent 40 minutes caring for this patient today including 35 minutes in face-to-face counseling regarding medication options.    Signed, Sherie Don, NP  07/12/23  2:22 PM  Electrophysiology [facility]

## 2023-07-11 NOTE — Progress Notes (Unsigned)
 Electrophysiology Clinic Note    Date:  07/12/2023  Patient ID:  Jonathon Bienvenue., DOB 04/13/1960, MRN [id number] PCP:  Mick Sell, MD  Cardiologist:  Debbe Odea, MD Electrophysiologist: Lanier Prude, MD   Discussed the use of AI scribe software for clinical note transcription with the patient, who gave verbal consent to proceed.   Patient Profile    Chief Complaint: Afib follow-up  History of Present Illness: Jonathon Guerry Covington. is a 63 y.o. male with PMH notable for persis AFib, CVA, HTN, OSA on CPAP; seen today for Lanier Prude, MD for routine electrophysiology followup.   He was last seen by Dr. Lalla Brothers 08/2022 for consult of AFib. Chadsvasc was low at 0-1, so 81mg  ASA continued. Not ablation candidate d/t BMI.  He was admitted 04/2023 for a CVA, thought embolic iso AFib. He was discharged on eliquis  He saw PA Dunn 04/2023 where he was continuing to heal from CVA, interested in rhythm control strategy. I saw him 05/2023, recommended tikosyn for AAD.   On follow-up today, patient has several questions about tikosyn loading admission, routine follow-up, and potential side effects of the medication. He denies chest pain, chest pressure, palpitations, SOB. He does have reduced energy at times, unsure whether this is related to AFib or other factors.   Continues to take eliquis BID, no bleeding concerns. He has no further follow-up with neurology.   His wife joins for appt whom I have met before, and also has several questions.   AAD History: None     ROS:  Please see the history of present illness. All other systems are reviewed and otherwise negative.    Physical Exam    VS:  There were no vitals taken for this visit. BMI: There is no height or weight on file to calculate BMI.  Wt Readings from Last 3 Encounters:  06/14/23 (!) 345 lb (156.5 kg)  06/03/23 (!) 344 lb 2 oz (156.1 kg)  05/13/23 (!) 346 lb 14.4 oz (157.4 kg)     GEN- The patient is  well appearing, alert and oriented x 3 today.   Lungs- Clear to ausculation bilaterally, normal work of breathing.  Heart- Irregularly irregular rate and rhythm, no murmurs, rubs or gallops Extremities- 1+ peripheral edema, warm, dry    Studies Reviewed   Previous EP, cardiology notes.    EKG is ordered. Personal review of EKG from today shows   EKG Interpretation Date/Time:  Tuesday July 12 2023 14:29:23 EDT Ventricular Rate:  64 PR Interval:    QRS Duration:  150 QT Interval:  432 QTC Calculation: 445 R Axis:   -60  Text Interpretation: Atrial fibrillation Right bundle branch block Left anterior fascicular block Bifascicular block Confirmed by Jonathon Evans [phone]) on 07/12/2023 2:32:54 PM    05/13/2023 AFib at 64. LAD, RBBB QRS QT ; QTC corrected for RBBB =   TTE, 04/25/2023  1. Left ventricular ejection fraction, by estimation, is 55 to 60%. The left ventricle has normal function. The left ventricle has no regional wall motion abnormalities. Left ventricular diastolic function could not be evaluated.   2. Right ventricular systolic function is normal. The right ventricular size is mildly enlarged. There is normal pulmonary artery systolic pressure. The estimated right ventricular systolic pressure is 14.2 mmHg.   3. Left atrial size was severely dilated.   4. Right atrial size was mildly dilated.   5. The mitral valve is grossly normal. Trivial  mitral valve regurgitation. No evidence of mitral stenosis.   6. The aortic valve is tricuspid. There is mild calcification of the aortic valve. Aortic valve regurgitation is not visualized. No aortic stenosis is present.   7. The inferior vena cava is normal in size with greater than 50% respiratory variability, suggesting right atrial pressure of 3 mmHg.   Comparison(s): No significant change from prior study.   TTE, 08/26/2022  1. Left ventricular ejection fraction, by estimation, is 55 to 60%. The left ventricle  has normal function. The left ventricle has no regional wall motion abnormalities. Left ventricular diastolic parameters are indeterminate.   2. Right ventricular systolic function is mildly reduced. The right ventricular size is mildly enlarged. Tricuspid regurgitation signal is inadequate for assessing PA pressure.   3. Left atrial size was moderately dilated.   4. Right atrial size was moderately dilated.   5. The mitral valve is normal in structure. Mild to moderate mitral valve regurgitation. No evidence of mitral stenosis.   6. The aortic valve is normal in structure. Aortic valve regurgitation is not visualized. No aortic stenosis is present.   7. There is borderline dilatation of the aortic root, measuring 39 mm. There is mild dilatation of the ascending aorta, measuring 42 mm. There is  borderline dilatation of the aortic arch, measuring 36 mm.   8. The inferior vena cava is normal in size with greater than 50% respiratory variability, suggesting right atrial pressure of 3 mmHg.    Assessment and Plan     #) persis AFib No overt s/s of arrhythmia Not ablation candidate d/t BMI Discussed AAD option - do not favor amio d/t young age, do not favor class I-C d/t RBBB on EKG We reviewed tikosyn vs multaq. Test claim for multaq is $0. He prefers to trial multaq at this time given it can be initiated in the outpatient setting, which I think is reasonable Start multaq 400mg  BID Continue 120mg  diltiazem daily Close follow-up in 2 weeks. If remains in afib, will proceed with DCCV  #) Hypercoag d/t persis afib #) h/o CVA CHA2DS2-VASc Score = at least 3 [CHF History: 0, HTN History: 1, Diabetes History: 0, Stroke History: 2, Vascular Disease History: 0, Age Score: 0, Gender Score: 0].  Therefore, the patient's annual risk of stroke is 3.2 %.    Stroke ppx - 5mg  eliquis BID, appropriately dosed No bleeding concerns       Current medicines are reviewed at length with the patient today.    The patient does not have concerns regarding his medicines.  The following changes were made today:   START 400mg  multaq in AM and PM  Labs/ tests ordered today include:  Orders Placed This Encounter  Procedures   EKG 12-Lead     Disposition: Follow up with EP APP  in 2 weeks    I spent 40 minutes caring for this patient today including 35 minutes in face-to-face counseling regarding medication options.    Signed, Jonathon Don, NP  07/12/23  2:22 PM  Electrophysiology [facility]

## 2023-07-12 ENCOUNTER — Other Ambulatory Visit: Payer: Self-pay

## 2023-07-12 ENCOUNTER — Telehealth (HOSPITAL_COMMUNITY): Payer: Self-pay | Admitting: Pharmacy Technician

## 2023-07-12 ENCOUNTER — Other Ambulatory Visit (HOSPITAL_COMMUNITY): Payer: Self-pay

## 2023-07-12 ENCOUNTER — Ambulatory Visit: Payer: Self-pay | Attending: Cardiology | Admitting: Cardiology

## 2023-07-12 VITALS — BP 156/89 | HR 64 | Ht 73.0 in | Wt 345.6 lb

## 2023-07-12 DIAGNOSIS — D6869 Other thrombophilia: Secondary | ICD-10-CM

## 2023-07-12 DIAGNOSIS — I4819 Other persistent atrial fibrillation: Secondary | ICD-10-CM

## 2023-07-12 MED ORDER — APIXABAN 5 MG PO TABS
5.0000 mg | ORAL_TABLET | Freq: Two times a day (BID) | ORAL | 2 refills | Status: AC
  Filled 2023-07-12 (×3): qty 180, 90d supply, fill #0

## 2023-07-12 MED ORDER — DRONEDARONE HCL 400 MG PO TABS
400.0000 mg | ORAL_TABLET | Freq: Two times a day (BID) | ORAL | 3 refills | Status: DC
  Filled 2023-07-12 (×3): qty 60, 30d supply, fill #0
  Filled 2023-08-07: qty 60, 30d supply, fill #1

## 2023-07-12 NOTE — Telephone Encounter (Signed)
 Patient Product/process development scientist completed.    The patient is insured through [employer]. Patient has [employer], may use a copay card, and/or apply for patient assistance if available.    Ran test claim for Multaq 400 mg and the current 30 day co-pay is $0.00.  Ran test claim for dofetilide (Tikosyn) 500 mg and Requires Prior Authorization  This test claim was processed through [employer]- copay amounts may vary at other pharmacies due to [employer], or as the patient moves through the different stages of their insurance plan.     Roland Earl, CPHT Pharmacy Technician III Certified Patient Advocate [facility] Patient Advocate Team Direct Number: [phone]  Fax: [phone]

## 2023-07-12 NOTE — Patient Instructions (Addendum)
 Medication Instructions:  START Multaq (Dronedarone) 400 mg twice daily    *If you need a refill on your cardiac medications before your next appointment, please call your pharmacy*  Follow-Up: At [facility], you and your health needs are our priority.  As part of our continuing mission to provide you with exceptional heart care, our providers are all part of one team.  This team includes your primary Cardiologist (physician) and Advanced Practice Providers or APPs (Physician Assistants and Nurse Practitioners) who all work together to provide you with the care you need, when you need it.  Your next appointment:   2 week(s)  Provider:   Sherie Don, NP    We recommend signing up for the patient portal called "MyChart".  Sign up information is provided on this After Visit Summary.  MyChart is used to connect with patients for Virtual Visits (Telemedicine).  Patients are able to view lab/test results, encounter notes, upcoming appointments, etc.  Non-urgent messages can be sent to your provider as well.   To learn more about what you can do with MyChart, go to [email or web address].

## 2023-07-13 ENCOUNTER — Other Ambulatory Visit (HOSPITAL_BASED_OUTPATIENT_CLINIC_OR_DEPARTMENT_OTHER): Payer: Self-pay

## 2023-07-13 ENCOUNTER — Other Ambulatory Visit: Payer: Self-pay

## 2023-07-13 DIAGNOSIS — G4733 Obstructive sleep apnea (adult) (pediatric): Secondary | ICD-10-CM | POA: Diagnosis not present

## 2023-07-22 ENCOUNTER — Emergency Department

## 2023-07-22 ENCOUNTER — Emergency Department
Admission: EM | Admit: 2023-07-22 | Discharge: 2023-07-22 | Disposition: A | Discharge status: Home / Self Care | Attending: Emergency Medicine | Admitting: Emergency Medicine

## 2023-07-22 ENCOUNTER — Other Ambulatory Visit: Payer: Self-pay

## 2023-07-22 DIAGNOSIS — I4891 Unspecified atrial fibrillation: Secondary | ICD-10-CM | POA: Insufficient documentation

## 2023-07-22 DIAGNOSIS — K828 Other specified diseases of gallbladder: Secondary | ICD-10-CM | POA: Diagnosis not present

## 2023-07-22 DIAGNOSIS — Z7901 Long term (current) use of anticoagulants: Secondary | ICD-10-CM | POA: Diagnosis not present

## 2023-07-22 DIAGNOSIS — R140 Abdominal distension (gaseous): Secondary | ICD-10-CM | POA: Diagnosis not present

## 2023-07-22 DIAGNOSIS — R1011 Right upper quadrant pain: Secondary | ICD-10-CM | POA: Diagnosis not present

## 2023-07-22 DIAGNOSIS — R109 Unspecified abdominal pain: Secondary | ICD-10-CM | POA: Diagnosis not present

## 2023-07-22 DIAGNOSIS — K8020 Calculus of gallbladder without cholecystitis without obstruction: Secondary | ICD-10-CM | POA: Insufficient documentation

## 2023-07-22 DIAGNOSIS — K429 Umbilical hernia without obstruction or gangrene: Secondary | ICD-10-CM | POA: Diagnosis not present

## 2023-07-22 LAB — URINALYSIS, ROUTINE W REFLEX MICROSCOPIC
Bacteria, UA: NONE SEEN
Bilirubin Urine: NEGATIVE
Glucose, UA: NEGATIVE mg/dL
Ketones, ur: NEGATIVE mg/dL
Leukocytes,Ua: NEGATIVE
Nitrite: NEGATIVE
Protein, ur: NEGATIVE mg/dL
Specific Gravity, Urine: 1.038 — ABNORMAL HIGH (ref 1.005–1.030)
Squamous Epithelial / HPF: 0 /HPF (ref 0–5)
pH: 5 (ref 5.0–8.0)

## 2023-07-22 LAB — CBC
HCT: 47.9 % (ref 39.0–52.0)
Hemoglobin: 16.1 g/dL (ref 13.0–17.0)
MCH: 28.6 pg (ref 26.0–34.0)
MCHC: 33.6 g/dL (ref 30.0–36.0)
MCV: 85.1 fL (ref 80.0–100.0)
Platelets: 176 10*3/uL (ref 150–400)
RBC: 5.63 MIL/uL (ref 4.22–5.81)
RDW: 12.7 % (ref 11.5–15.5)
WBC: 10 10*3/uL (ref 4.0–10.5)
nRBC: 0 % (ref 0.0–0.2)

## 2023-07-22 LAB — COMPREHENSIVE METABOLIC PANEL WITH GFR
ALT: 13 U/L (ref 0–44)
AST: 19 U/L (ref 15–41)
Albumin: 4.1 g/dL (ref 3.5–5.0)
Alkaline Phosphatase: 61 U/L (ref 38–126)
Anion gap: 10 (ref 5–15)
BUN: 12 mg/dL (ref 8–23)
CO2: 26 mmol/L (ref 22–32)
Calcium: 9.3 mg/dL (ref 8.9–10.3)
Chloride: 103 mmol/L (ref 98–111)
Creatinine, Ser: 1.35 mg/dL — ABNORMAL HIGH (ref 0.61–1.24)
GFR, Estimated: 59 mL/min — ABNORMAL LOW (ref >60)
Glucose, Bld: 188 mg/dL — ABNORMAL HIGH (ref 70–99)
Potassium: 4 mmol/L (ref 3.5–5.1)
Sodium: 139 mmol/L (ref 135–145)
Total Bilirubin: 1.1 mg/dL (ref 0.0–1.2)
Total Protein: 7.6 g/dL (ref 6.5–8.1)

## 2023-07-22 LAB — LIPASE, BLOOD: Lipase: 27 U/L (ref 11–51)

## 2023-07-22 MED ORDER — IOHEXOL 350 MG/ML SOLN
100.0000 mL | Freq: Once | INTRAVENOUS | Status: AC | PRN
  Administered 2023-07-22: 125 mL via INTRAVENOUS

## 2023-07-22 MED ORDER — MORPHINE SULFATE (PF) 4 MG/ML IV SOLN
4.0000 mg | Freq: Once | INTRAVENOUS | Status: AC
  Administered 2023-07-22: 4 mg via INTRAVENOUS
  Filled 2023-07-22: qty 1

## 2023-07-22 MED ORDER — SODIUM CHLORIDE 0.9 % IV BOLUS
1000.0000 mL | Freq: Once | INTRAVENOUS | Status: AC
  Administered 2023-07-22: 1000 mL via INTRAVENOUS

## 2023-07-22 MED ORDER — ONDANSETRON HCL 4 MG PO TABS: 4.0000 mg | ORAL_TABLET | Freq: Four times a day (QID) | ORAL | 0 refills | Status: DC | PRN

## 2023-07-22 MED ORDER — HYDROCODONE-ACETAMINOPHEN 5-325 MG PO TABS: 1.0000 | ORAL_TABLET | Freq: Four times a day (QID) | ORAL | 0 refills | Status: DC | PRN

## 2023-07-22 MED ORDER — ONDANSETRON HCL 4 MG/2ML IJ SOLN
4.0000 mg | Freq: Once | INTRAMUSCULAR | Status: AC
  Administered 2023-07-22: 4 mg via INTRAVENOUS
  Filled 2023-07-22: qty 2

## 2023-07-22 MED ORDER — HYDROMORPHONE HCL 1 MG/ML IJ SOLN
0.5000 mg | Freq: Once | INTRAMUSCULAR | Status: AC
  Administered 2023-07-22: 0.5 mg via INTRAVENOUS
  Filled 2023-07-22: qty 0.5

## 2023-07-22 NOTE — ED Triage Notes (Signed)
 Pt reports generalized abd pain that began tonight after eating. Pt states he also has n/v.

## 2023-07-22 NOTE — Discharge Instructions (Addendum)
 You were seen in the ER today for evaluation of your abdominal pain.  Your ultrasound showed that you have gallstones that I suspect are contributing to your pain.  Please follow-up with general surgery for further evaluation and consideration of surgery to remove your gallbladder.  I sent a prescription for nausea medication to your pharmacy that you can take as needed.  If you have breakthrough pain, I have sent a short course of narcotic pain medication to your pharmacy. This can make you drowsy. Do not drive or operate machinery when taking this.  Return to the ER for new or worsening symptoms including worsening abdominal pain, inability to tolerate food or liquids, or any other new or concerning symptoms.

## 2023-07-22 NOTE — ED Provider Notes (Signed)
 [facility] Provider Note    Event Date/Time   First MD Initiated Contact with Patient 07/22/23 0115     (approximate)   History   Abdominal Pain   HPI  Jonathon Aarion Metzgar. is a 63 year old male with history of atrial fibrillation on Eliquis, CVA presenting to the emergency department for evaluation of abdominal pain.  Around 8 PM tonight, patient had onset of generalized abdominal pain most notable over the right side of his abdomen.  Associated nausea with 1 episode of nonbloody vomiting.  No diarrhea.  Last bowel movement was earlier today.  No history of abdominal surgeries.    Physical Exam   Triage Vital Signs: ED Triage Vitals  Encounter Vitals Group     BP 07/22/23 0117 (!) 166/80     Systolic BP Percentile --      Diastolic BP Percentile --      Pulse Rate 07/22/23 0117 80     Resp 07/22/23 0117 18     Temp 07/22/23 0117 (!) 97.5 F (36.4 C)     Temp Source 07/22/23 0117 Oral     SpO2 07/22/23 0117 95 %     Weight 07/22/23 0116 (!) 345 lb (156.5 kg)     Height 07/22/23 0116 6\' 1"  (1.854 m)     Head Circumference --      Peak Flow --      Pain Score 07/22/23 0116 7     Pain Loc --      Pain Education --      Exclude from Growth Chart --     Most recent vital signs: Vitals:   07/22/23 0353 07/22/23 0400  BP:  (!) 144/82  Pulse:  71  Resp:    Temp: 98.7 F (37.1 C)   SpO2:       General: Awake, interactive  CV:  Regular rate, good peripheral perfusion.  Resp:  Unlabored respirations.  Abd:  Nondistended, soft, tender to palpation most notably in the right upper quadrant, negative Murphy sign Neuro:  Symmetric facial movement, fluid speech   ED Results / Procedures / Treatments   Labs (all labs ordered are listed, but only abnormal results are displayed) Labs Reviewed  COMPREHENSIVE METABOLIC PANEL WITH GFR - Abnormal; Notable for the following components:      Result Value   Glucose, Bld 188 (*)    Creatinine, Ser 1.35  (*)    GFR, Estimated 59 (*)    All other components within normal limits  URINALYSIS, ROUTINE W REFLEX MICROSCOPIC - Abnormal; Notable for the following components:   Color, Urine YELLOW (*)    APPearance CLEAR (*)    Specific Gravity, Urine 1.038 (*)    Hgb urine dipstick SMALL (*)    All other components within normal limits  LIPASE, BLOOD  CBC     EKG EKG independently reviewed interpreted by myself (ER attending) demonstrates:    RADIOLOGY Imaging independently reviewed and interpreted by myself demonstrates:   US Abdomen Limited RUQ (LIVER/GB) Result Date: 07/22/2023 CLINICAL DATA:  Right upper quadrant pain EXAM: ULTRASOUND ABDOMEN LIMITED RIGHT UPPER QUADRANT COMPARISON:  Abdominal CT from earlier today FINDINGS: Gallbladder: Small layering gallstones. Mild generalized gallbladder wall thickening of 4 mm without focal tenderness. No pericholecystic edema. Common bile duct: Diameter: 5 mm Liver: No focal lesion identified. Within normal limits in parenchymal echogenicity. Portal vein is patent on color Doppler imaging with normal direction of blood flow towards the liver. IMPRESSION: Cholelithiasis without  focal tenderness or pericholecystic edema to imply acute cholecystitis. Electronically Signed   By: Tiburcio Pea M.D.   On: 07/22/2023 04:50   CT ABDOMEN PELVIS W CONTRAST Result Date: 07/22/2023 CLINICAL DATA:  Abdomen pain EXAM: CT ABDOMEN AND PELVIS WITH CONTRAST TECHNIQUE: Multidetector CT imaging of the abdomen and pelvis was performed using the standard protocol following bolus administration of intravenous contrast. RADIATION DOSE REDUCTION: This exam was performed according to the departmental dose-optimization program which includes automated exposure control, adjustment of the mA and/or kV according to patient size and/or use of iterative reconstruction technique. CONTRAST:  OMNIPAQUE IOHEXOL 350 MG/ML SOLN COMPARISON:  None Available. FINDINGS: Lower chest: Lung  bases demonstrate no acute airspace disease. 4 mm left lower lobe pulmonary nodule on series 4, image 71. Cardiomegaly. Coronary vascular calcification Hepatobiliary: Small gallstones. No focal hepatic abnormality or biliary dilatation. Pancreas: Unremarkable. No pancreatic ductal dilatation or surrounding inflammatory changes. Spleen: Normal in size without focal abnormality. Adrenals/Urinary Tract: Adrenal glands are unremarkable. Kidneys are normal, without renal calculi, focal lesion, or hydronephrosis. Bladder is unremarkable. Stomach/Bowel: Moderate fluid distension of the stomach. Scattered fluid-filled nondilated small bowel. No acute bowel wall thickening. Negative appendix Vascular/Lymphatic: Aortic atherosclerosis. No enlarged abdominal or pelvic lymph nodes. Reproductive: Prostate is unremarkable. Other: Negative for pelvic effusion or free air. Small fat containing inguinal hernias. Small fat containing umbilical hernia Musculoskeletal: No acute or suspicious osseous abnormality. Multilevel degenerative changes. IMPRESSION: 1. Moderate fluid distension of the stomach. Scattered fluid-filled nondilated small bowel. No evidence for a bowel obstruction. 2. Cholelithiasis. 3. 4 mm left lower lobe pulmonary nodule. No follow-up needed if patient is low-risk.This recommendation follows the consensus statement: Guidelines for Management of Incidental Pulmonary Nodules Detected on CT Images: From the Fleischner Society 2017; Radiology 2017; 284:228-243. 4. Aortic atherosclerosis. Electronically Signed   By: Jasmine Pang M.D.   On: 07/22/2023 03:17      PROCEDURES:  Critical Care performed: No  Procedures   MEDICATIONS ORDERED IN ED: Medications  ondansetron (ZOFRAN) injection 4 mg (4 mg Intravenous Given 07/22/23 0149)  morphine (PF) 4 MG/ML injection 4 mg (4 mg Intravenous Given 07/22/23 0151)  sodium chloride 0.9 % bolus 1,000 mL (1,000 mLs Intravenous New Bag/Given 07/22/23 0154)  iohexol  (OMNIPAQUE) 350 MG/ML injection 100 mL (125 mLs Intravenous Contrast Given 07/22/23 0250)  HYDROmorphone (DILAUDID) injection 0.5 mg (0.5 mg Intravenous Given 07/22/23 0350)     IMPRESSION / MDM / ASSESSMENT AND PLAN / ED COURSE  I reviewed the triage vital signs and the nursing notes.  Differential diagnosis includes, but is not limited to, pancreatitis, biliary pathology, appendicitis, other acute intra-abdominal process  Patient's presentation is most consistent with acute presentation with potential threat to life or bodily function.  63 year old male presenting with abdominal pain.  Will obtain labs, CT to further evaluate.  IV fluids, morphine, Zofran ordered for symptomatic relief.  Clinical Course as of 07/22/23 1610  Fri Jul 22, 2023  0327 Patient reassessed.  Had initial improvement in abdominal pain, does report recurrent pain.  On exam, pain does seem to be most prominent in the right upper quadrant.  With gallstones noted on CT, will obtain ultrasound to ensure no findings suggestive of cholecystitis. [NR]  0459 US Abdomen Limited RUQ (LIVER/GB) Ultrasound redemonstrates gallstones without obvious findings suggestive of acute cholecystitis.  Patient reassessed.  Does feel improved.  Do suspect that his gallstones may be contributing to his symptoms.  I did discuss discharge with outpatient follow-up with general  surgery for further evaluation.  Patient is comfortable with this plan.  Will DC with short course of pain and nausea medication.  Strict return precautions provided.  Patient discharged in stable condition. [NR]    Clinical Course User Index [NR] Trinna Post, MD     FINAL CLINICAL IMPRESSION(S) / ED DIAGNOSES   Final diagnoses:  Right upper quadrant abdominal pain  Calculus of gallbladder without cholecystitis without obstruction     Rx / DC Orders   ED Discharge Orders          Ordered    ondansetron (ZOFRAN) 4 MG tablet  Every 6 hours PRN        07/22/23  0508    HYDROcodone-acetaminophen (NORCO/VICODIN) 5-325 MG tablet  Every 6 hours PRN        07/22/23 0508             Note:  This document was prepared using Dragon voice recognition software and may include unintentional dictation errors.   Trinna Post, MD 07/22/23 [phone]

## 2023-07-26 ENCOUNTER — Ambulatory Visit: Attending: Cardiology | Admitting: Cardiology

## 2023-07-26 ENCOUNTER — Encounter: Payer: Self-pay | Admitting: Cardiology

## 2023-07-26 VITALS — BP 127/85 | HR 63 | Resp 16 | Ht 73.0 in | Wt 335.0 lb

## 2023-07-26 DIAGNOSIS — I639 Cerebral infarction, unspecified: Secondary | ICD-10-CM

## 2023-07-26 DIAGNOSIS — I4819 Other persistent atrial fibrillation: Secondary | ICD-10-CM | POA: Diagnosis not present

## 2023-07-26 DIAGNOSIS — D6869 Other thrombophilia: Secondary | ICD-10-CM | POA: Diagnosis not present

## 2023-07-26 NOTE — Patient Instructions (Signed)
 Medication Instructions:  The current medical regimen is effective;  continue present plan and medications as directed. Please refer to the Current Medication list given to you today.   *If you need a refill on your cardiac medications before your next appointment, please call your pharmacy*  Testing/Procedures: Your physician has recommended that you have a Cardioversion (DCCV). Electrical Cardioversion uses a jolt of electricity to your heart either through paddles or wired patches attached to your chest. This is a controlled, usually prescheduled, procedure. Defibrillation is done under light anesthesia in the hospital, and you usually go home the day of the procedure. This is done to get your heart back into a normal rhythm. You are not awake for the procedure. Please see the instruction sheet given to you today.   Follow-Up: At [facility], you and your health needs are our priority.  As part of our continuing mission to provide you with exceptional heart care, our providers are all part of one team.  This team includes your primary Cardiologist (physician) and Advanced Practice Providers or APPs (Physician Assistants and Nurse Practitioners) who all work together to provide you with the care you need, when you need it.  Your next appointment:   2-3 week(s) post Cardioversion   Provider:   Adaline Holly, NP    We recommend signing up for the patient portal called "MyChart".  Sign up information is provided on this After Visit Summary.  MyChart is used to connect with patients for Virtual Visits (Telemedicine).  Patients are able to view lab/test results, encounter notes, upcoming appointments, etc.  Non-urgent messages can be sent to your provider as well.   To learn more about what you can do with MyChart, go to [email or web address].   Other Instructions     You are scheduled for a Cardioversion on Wednesday, April 23 with Dr. Gollan.  Please arrive at the [facility]  Center Entrance of [facility], [address], [place] [address] at 6:30 AM (This is 1 hour(s) prior to your procedure time).  Proceed to the Check-In Desk directly inside the entrance.  Procedure Parking: Use the entrance off of the [address] side of the hospital. Turn right upon entering and follow the driveway to parking that is directly in front of the Heart & Vascular Center. There is no valet parking available at this entrance, however there is an awning directly in front of the Heart & Vascular Center for drop off/ pick up for patients.    DIET:  Nothing to eat or drink after midnight except a sip of water with medications (see medication instructions below)  MEDICATION INSTRUCTIONS: !!IF ANY NEW MEDICATIONS ARE STARTED AFTER TODAY, PLEASE NOTIFY YOUR PROVIDER AS SOON AS POSSIBLE!!  FYI: Medications such as Semaglutide (Ozempic, [place]), Tirzepatide (Mounjaro, Zepbound), Dulaglutide (Trulicity), etc ("GLP1 agonists") AND Canagliflozin (Invokana), Dapagliflozin (Farxiga), Empagliflozin (Jardiance), Ertugliflozin (Steglatro), Bexagliflozin [employer]) or any combination with one of these drugs such as Invokamet (Canagliflozin/Metformin), Synjardy (Empagliflozin/Metformin), etc ("SGLT2 inhibitors") must be held around the time of a procedure. This is not a comprehensive list of all of these drugs. Please review all of your medications and talk to your provider if you take any one of these. If you are not sure, ask your provider.     Continue taking your anticoagulant (blood thinner): Apixaban (Eliquis).  You will need to continue this after your procedure until you are told by your provider that it is safe to stop.    LABS:  no  labs are needed at this time- recent labs obtained 07/22/2023   FYI:  For your safety, and to allow us  to monitor your vital signs accurately during the surgery/procedure we request: If you have artificial nails, gel coating, SNS etc, please have those removed  prior to your surgery/procedure. Not having the nail coverings /polish removed may result in cancellation or delay of your surgery/procedure.  Your support person will be asked to wait in the waiting room during your procedure.  It is OK to have someone drop you off and come back when you are ready to be discharged.  You cannot drive after the procedure and will need someone to drive you home.  Bring your insurance cards.  *Special Note: Every effort is made to have your procedure done on time. Occasionally there are emergencies that occur at the hospital that may cause delays. Please be patient if a delay does occur.

## 2023-07-26 NOTE — Progress Notes (Addendum)
 Electrophysiology Clinic Note    Date:  07/26/2023  Patient ID:  Jonathon Billig., DOB 08/05/1960, MRN [id number] PCP:  Jonathon Sell, MD  Cardiologist:  Jonathon Odea, MD Electrophysiologist: Jonathon Prude, MD   Discussed the use of AI scribe software for clinical note transcription with the patient, who gave verbal consent to proceed.   Patient Profile    Chief Complaint: Afib follow-up  History of Present Illness: Jonathon Taison Celani. is a 63 y.o. male with PMH notable for persis AFib, CVA, HTN, OSA on CPAP; seen today for Jonathon Prude, MD for routine electrophysiology followup.   He was last seen by Dr. Lalla Evans 08/2022 for consult of AFib. Chadsvasc was low at 0-1, so 81mg  ASA continued. Not ablation candidate d/t BMI.  He was admitted 04/2023 for a CVA, thought embolic iso AFib. He was discharged on eliquis.   I saw him earlier this month to discuss AAD, specifically tikosyn vs multaq. He wanted to trial multaq.   On follow-up today, he had a recent flare up of cholecystitis earlier this week. He is planning to see GI surgeon later this week to determine whether surgery is needed. He continues to take eliquis and multaq BID, no bleeding concerns on eliquis. No missed doses.   His wife joins for appt whom I have met before   AAD History: Multaq     ROS:  Please see the history of present illness. All other systems are reviewed and otherwise negative.    Physical Exam    VS:  BP 127/85 (BP Location: Left Arm, Patient Position: Sitting, Cuff Size: Large)   Pulse 63   Resp 16   Ht 6\' 1"  (1.854 m)   Wt (!) 335 lb (152 kg)   SpO2 97%   BMI 44.20 kg/m  BMI: Body mass index is 44.2 kg/m.  Wt Readings from Last 3 Encounters:  07/26/23 (!) 335 lb (152 kg)  07/22/23 (!) 345 lb (156.5 kg)  07/12/23 (!) 345 lb 9.6 oz (156.8 kg)     GEN- The patient is well appearing, alert and oriented x 3 today.   Lungs- Clear to ausculation bilaterally, normal work  of breathing.  Heart- Irregularly irregular rate and rhythm, no murmurs, rubs or gallops Extremities- 1+ peripheral edema, warm, dry    Studies Reviewed   Previous EP, cardiology notes.    EKG is ordered. Personal review of EKG from today shows   EKG Interpretation Date/Time:  Tuesday July 26 2023 14:20:04 EDT Ventricular Rate:  58 PR Interval:    QRS Duration:  146 QT Interval:  470 QTC Calculation: 461 R Axis:   -15  Text Interpretation: Atrial fibrillation with slow ventricular response Right bundle branch block Confirmed by Jonathon Evans [phone]) on 07/26/2023 2:23:43 PM    05/13/2023 AFib at 64. LAD, RBBB QRS QT ; QTC corrected for RBBB =   TTE, 04/25/2023  1. Left ventricular ejection fraction, by estimation, is 55 to 60%. The left ventricle has normal function. The left ventricle has no regional wall motion abnormalities. Left ventricular diastolic function could not be evaluated.   2. Right ventricular systolic function is normal. The right ventricular size is mildly enlarged. There is normal pulmonary artery systolic pressure. The estimated right ventricular systolic pressure is 14.2 mmHg.   3. Left atrial size was severely dilated.   4. Right atrial size was mildly dilated.   5. The mitral valve is grossly normal. Trivial  mitral valve regurgitation. No evidence of mitral stenosis.   6. The aortic valve is tricuspid. There is mild calcification of the aortic valve. Aortic valve regurgitation is not visualized. No aortic stenosis is present.   7. The inferior vena cava is normal in size with greater than 50% respiratory variability, suggesting right atrial pressure of 3 mmHg.   Comparison(s): No significant change from prior study.   TTE, 08/26/2022  1. Left ventricular ejection fraction, by estimation, is 55 to 60%. The left ventricle has normal function. The left ventricle has no regional wall motion abnormalities. Left ventricular diastolic parameters  are indeterminate.   2. Right ventricular systolic function is mildly reduced. The right ventricular size is mildly enlarged. Tricuspid regurgitation signal is inadequate for assessing PA pressure.   3. Left atrial size was moderately dilated.   4. Right atrial size was moderately dilated.   5. The mitral valve is normal in structure. Mild to moderate mitral valve regurgitation. No evidence of mitral stenosis.   6. The aortic valve is normal in structure. Aortic valve regurgitation is not visualized. No aortic stenosis is present.   7. There is borderline dilatation of the aortic root, measuring 39 mm. There is mild dilatation of the ascending aorta, measuring 42 mm. There is  borderline dilatation of the aortic arch, measuring 36 mm.   8. The inferior vena cava is normal in size with greater than 50% respiratory variability, suggesting right atrial pressure of 3 mmHg.    Assessment and Plan     #) persis AFib No overt s/s of arrhythmia Not ablation candidate d/t BMI Remains in afib on multaq 400mg  BID Will plan for cardioversion at next opportunity. Timing of DCCV will depend on GI surgeon's recommendation of cholelithiasis. If surgery is recommended, will need to delay DCCV so that he can remain on OAC for 4 weeks after procedure.  Tentatively planned for DCCV next week Recent CBC, BMP stable   #) Hypercoag d/t persis afib #) h/o CVA CHA2DS2-VASc Score = at least 3 [CHF History: 0, HTN History: 1, Diabetes History: 0, Stroke History: 2, Vascular Disease History: 0, Age Score: 0, Gender Score: 0].  Therefore, the patient's annual risk of stroke is 3.2 %.    Stroke ppx - 5mg  eliquis BID, appropriately dosed No bleeding concerns   Informed Consent   Shared Decision Making/Informed Consent The risks (stroke, cardiac arrhythmias rarely resulting in the need for a temporary or permanent pacemaker, skin irritation or burns and complications associated with conscious sedation including  aspiration, arrhythmia, respiratory failure and death), benefits (restoration of normal sinus rhythm) and alternatives of a direct current cardioversion were explained in detail to Jonathon Evans and he agrees to proceed.           Current medicines are reviewed at length with the patient today.   The patient does not have concerns regarding his medicines.  The following changes were made today:   none  Labs/ tests ordered today include:  Orders Placed This Encounter  Procedures   EKG 12-Lead     Disposition: Follow up with EP APP  as usual post procedure     Signed, Adaline Holly, NP  07/26/23  3:06 PM  Electrophysiology [facility]

## 2023-07-28 ENCOUNTER — Encounter: Payer: Self-pay | Admitting: Cardiology

## 2023-07-28 DIAGNOSIS — K8020 Calculus of gallbladder without cholecystitis without obstruction: Secondary | ICD-10-CM | POA: Diagnosis not present

## 2023-08-02 MED ORDER — SODIUM CHLORIDE 0.9 % IV SOLN: INTRAVENOUS | Status: DC

## 2023-08-02 MED ORDER — HYDROCORTISONE 1 % EX CREA: 1.0000 | TOPICAL_CREAM | Freq: Three times a day (TID) | CUTANEOUS | Status: DC | PRN

## 2023-08-03 ENCOUNTER — Ambulatory Visit: Admitting: Registered Nurse

## 2023-08-03 ENCOUNTER — Encounter
Admission: RE | Disposition: A | Discharge status: Home / Self Care | Payer: Self-pay | Source: Home / Self Care | Attending: Cardiovascular Disease

## 2023-08-03 ENCOUNTER — Encounter: Payer: Self-pay | Admitting: Cardiovascular Disease

## 2023-08-03 ENCOUNTER — Other Ambulatory Visit: Payer: Self-pay

## 2023-08-03 ENCOUNTER — Ambulatory Visit
Admission: RE | Admit: 2023-08-03 | Discharge: 2023-08-03 | Disposition: A | Discharge status: Home / Self Care | Attending: Cardiovascular Disease | Admitting: Cardiovascular Disease

## 2023-08-03 DIAGNOSIS — I4819 Other persistent atrial fibrillation: Secondary | ICD-10-CM | POA: Diagnosis not present

## 2023-08-03 DIAGNOSIS — K219 Gastro-esophageal reflux disease without esophagitis: Secondary | ICD-10-CM | POA: Insufficient documentation

## 2023-08-03 DIAGNOSIS — G4730 Sleep apnea, unspecified: Secondary | ICD-10-CM | POA: Diagnosis not present

## 2023-08-03 DIAGNOSIS — E66813 Obesity, class 3: Secondary | ICD-10-CM | POA: Insufficient documentation

## 2023-08-03 DIAGNOSIS — Z6841 Body Mass Index (BMI) 40.0 and over, adult: Secondary | ICD-10-CM | POA: Insufficient documentation

## 2023-08-03 DIAGNOSIS — I700 Atherosclerosis of aorta: Secondary | ICD-10-CM | POA: Diagnosis not present

## 2023-08-03 DIAGNOSIS — I452 Bifascicular block: Secondary | ICD-10-CM | POA: Diagnosis not present

## 2023-08-03 DIAGNOSIS — I10 Essential (primary) hypertension: Secondary | ICD-10-CM | POA: Insufficient documentation

## 2023-08-03 DIAGNOSIS — R0602 Shortness of breath: Secondary | ICD-10-CM

## 2023-08-03 DIAGNOSIS — I4891 Unspecified atrial fibrillation: Secondary | ICD-10-CM | POA: Diagnosis not present

## 2023-08-03 DIAGNOSIS — R7303 Prediabetes: Secondary | ICD-10-CM | POA: Insufficient documentation

## 2023-08-03 DIAGNOSIS — G4733 Obstructive sleep apnea (adult) (pediatric): Secondary | ICD-10-CM | POA: Insufficient documentation

## 2023-08-03 HISTORY — PX: CARDIOVERSION: SHX1299

## 2023-08-03 SURGERY — CARDIOVERSION
Anesthesia: General

## 2023-08-03 MED ORDER — PROPOFOL 10 MG/ML IV BOLUS
INTRAVENOUS | Status: DC | PRN
Start: 2023-08-03 — End: 2023-08-03
  Administered 2023-08-03: 100 mg via INTRAVENOUS

## 2023-08-03 MED ORDER — PROPOFOL 10 MG/ML IV BOLUS
INTRAVENOUS | Status: AC
  Filled 2023-08-03: qty 40

## 2023-08-03 MED ORDER — LIDOCAINE HCL (PF) 2 % IJ SOLN
INTRAMUSCULAR | Status: AC
Start: 2023-08-03 — End: ?
  Filled 2023-08-03: qty 5

## 2023-08-03 MED ORDER — LIDOCAINE HCL (CARDIAC) PF 100 MG/5ML IV SOSY
PREFILLED_SYRINGE | INTRAVENOUS | Status: DC | PRN
  Administered 2023-08-03: 100 mg via INTRAVENOUS

## 2023-08-03 NOTE — Anesthesia Procedure Notes (Signed)
 Procedure Name: General with mask airway Date/Time: 08/03/2023 7:20 AM  Performed by: Marisue Side, CRNAPre-anesthesia Checklist: Patient identified, Emergency Drugs available, Suction available and Patient being monitored Patient Re-evaluated:Patient Re-evaluated prior to induction Oxygen Delivery Method: Simple face mask Preoxygenation: Pre-oxygenation with 100% oxygen Induction Type: IV induction

## 2023-08-03 NOTE — Transfer of Care (Signed)
 Immediate Anesthesia Transfer of Care Note  Patient: Jonathon Evans.  Procedure(s) Performed: CARDIOVERSION  Patient Location: Short Stay  Anesthesia Type:General  Level of Consciousness: awake and patient cooperative  Airway & Oxygen Therapy: Patient Spontanous Breathing and Patient connected to nasal cannula oxygen  Post-op Assessment: Report given to RN and Post -op Vital signs reviewed and stable  Post vital signs: Reviewed and stable  Last Vitals:  Vitals Value Taken Time  BP 133/91 08/03/23 0742  Temp    Pulse 68 08/03/23 0742  Resp 15 08/03/23 0742  SpO2 97 % 08/03/23 0742    Last Pain:  Vitals:   08/03/23 0654  TempSrc: Oral  PainSc: 0-No pain         Complications: No notable events documented.

## 2023-08-03 NOTE — Anesthesia Postprocedure Evaluation (Signed)
 Anesthesia Post Note  Patient: Jonathon Evans.  Procedure(s) Performed: CARDIOVERSION  Patient location during evaluation: PACU Anesthesia Type: General Level of consciousness: awake and alert Pain management: pain level controlled Vital Signs Assessment: post-procedure vital signs reviewed and stable Respiratory status: spontaneous breathing, nonlabored ventilation, respiratory function stable and patient connected to nasal cannula oxygen Cardiovascular status: blood pressure returned to baseline and stable Postop Assessment: no apparent nausea or vomiting Anesthetic complications: no   No notable events documented.   Last Vitals:  Vitals:   08/03/23 0800 08/03/23 0815  BP: 112/77 111/78  Pulse:    Resp:    Temp:    SpO2: 98% 95%    Last Pain:  Vitals:   08/03/23 0815  TempSrc:   PainSc: 0-No pain                 Zula Hitch

## 2023-08-03 NOTE — CV Procedure (Signed)
Cardioversion procedure note For atrial fibrillation, persistent  Procedure Details:  Consent: Risks of procedure as well as the alternatives and risks of each were explained to the (patient/caregiver). Consent for procedure obtained.  Time Out: Verified patient identification, verified procedure, site/side was marked, verified correct patient position, special equipment/implants available, medications/allergies/relevent history reviewed, required imaging and test results available. Performed  Patient placed on cardiac monitor, pulse oximetry, supplemental oxygen as necessary.  Sedation given: propofol IV, Dr. Adams Pacer pads placed anterior and posterior chest.   Cardioverted 1 time(s).  Cardioverted at 200  J. Synchronized biphasic Converted to NSR   Evaluation: Findings: Post procedure EKG shows: NSR Complications: None Patient did tolerate procedure well.  Time Spent Directly with the Patient:  45 minutes   Tim Makhya Arave, M.D., Ph.D. 

## 2023-08-03 NOTE — Anesthesia Preprocedure Evaluation (Signed)
 Anesthesia Evaluation  Patient identified by MRN, date of birth, ID band Patient awake    Reviewed: Allergy & Precautions, H&P , NPO status , Patient's Chart, lab work & pertinent test results, reviewed documented beta blocker date and time   Airway Mallampati: II   Neck ROM: full    Dental  (+) Poor Dentition   Pulmonary sleep apnea    Pulmonary exam normal        Cardiovascular negative cardio ROS Normal cardiovascular exam Rhythm:regular Rate:Normal     Neuro/Psych CVA, Residual Symptoms  negative psych ROS   GI/Hepatic Neg liver ROS,GERD  Medicated,,  Endo/Other    Class 3 obesity  Renal/GU negative Renal ROS  negative genitourinary   Musculoskeletal   Abdominal   Peds  Hematology negative hematology ROS (+)   Anesthesia Other Findings Past Medical History: No date: Arthritis     Comment:  Right shoulder No date: GERD (gastroesophageal reflux disease) No date: Hyperlipidemia No date: Pre-diabetes No date: Sleep apnea     Comment:  CPAP Past Surgical History: 02/12/2019: CATARACT EXTRACTION W/PHACO; Right     Comment:  Procedure: CATARACT EXTRACTION PHACO AND INTRAOCULAR               LENS PLACEMENT (IOC) RIGHT 00:44.3            13.1%                    5.86;  Surgeon: Rosa College, MD;  Location: [facility];  Service: Ophthalmology;  Laterality:               Right;  sleep apnea 07/19/2022: CATARACT EXTRACTION W/PHACO; Left     Comment:  Procedure: CATARACT EXTRACTION PHACO AND INTRAOCULAR               LENS PLACEMENT (IOC) LEFT  2.50  00:20.0;  Surgeon: Rosa College, MD;  Location: [facility];                Service: Ophthalmology;  Laterality: Left; No date: colonoscopy and upper endoscopy 02/16/2016: COLONOSCOPY WITH PROPOFOL ; N/A     Comment:  Procedure: COLONOSCOPY WITH PROPOFOL ;  Surgeon: Deveron Fly, MD;  Location: [facility]  ENDOSCOPY;  Service:               Endoscopy;  Laterality: N/A; 04/25/2023: IR CT HEAD LTD 04/25/2023: IR PERCUTANEOUS ART THROMBECTOMY/INFUSION INTRACRANIAL INC  DIAG ANGIO 04/25/2023: RADIOLOGY WITH ANESTHESIA; N/A     Comment:  Procedure: RADIOLOGY WITH ANESTHESIA;  Surgeon:               Radiologist, Medication, MD;  Location: [facility] OR;  Service:               Radiology;  Laterality: N/A; BMI    Body Mass Index: 43.25 kg/m     Reproductive/Obstetrics negative OB ROS                             Anesthesia Physical Anesthesia Plan  ASA: 4 and emergent  Anesthesia Plan: General   Post-op Pain Management:    Induction:   PONV Risk Score and Plan: 2  Airway Management Planned:   Additional  Equipment:   Intra-op Plan:   Post-operative Plan:   Informed Consent: I have reviewed the patients History and Physical, chart, labs and discussed the procedure including the risks, benefits and alternatives for the proposed anesthesia with the patient or authorized representative who has indicated his/her understanding and acceptance.     Dental Advisory Given  Plan Discussed with: CRNA  Anesthesia Plan Comments:        Anesthesia Quick Evaluation

## 2023-08-04 ENCOUNTER — Encounter: Payer: Self-pay | Admitting: Cardiovascular Disease

## 2023-08-05 NOTE — H&P (Signed)

## 2023-08-05 NOTE — Interval H&P Note (Signed)
 History and Physical Interval Note:  08/05/2023 5:51 PM  Jonathon Evans.  has presented today for surgery, with the diagnosis of Cardioversion   Afib.  The various methods of treatment have been discussed with the patient and family. After consideration of risks, benefits and other options for treatment, the patient has consented to  Procedure(s): CARDIOVERSION (N/A) as a surgical intervention.  The patient's history has been reviewed, patient examined, no change in status, stable for surgery.  I have reviewed the patient's chart and labs.  Questions were answered to the patient's satisfaction.     Sueellen Kayes

## 2023-08-08 ENCOUNTER — Telehealth: Payer: Self-pay

## 2023-08-08 NOTE — Patient Outreach (Signed)
 Telephone outreach to patient to obtain mRS was successfully completed. MRS= 0   Vanice Sarah [facility] Health Care Management Assistant  Direct Dial: [phone]  Fax: [phone] Website: [email or web address]

## 2023-08-12 ENCOUNTER — Ambulatory Visit: Payer: 59 | Admitting: Nurse Practitioner

## 2023-08-12 DIAGNOSIS — G4733 Obstructive sleep apnea (adult) (pediatric): Secondary | ICD-10-CM | POA: Diagnosis not present

## 2023-08-18 ENCOUNTER — Ambulatory Visit: Attending: Cardiology | Admitting: Cardiology

## 2023-08-18 VITALS — BP 122/80 | HR 80 | Resp 16 | Ht 73.0 in | Wt 325.4 lb

## 2023-08-18 DIAGNOSIS — I639 Cerebral infarction, unspecified: Secondary | ICD-10-CM

## 2023-08-18 DIAGNOSIS — I4819 Other persistent atrial fibrillation: Secondary | ICD-10-CM | POA: Diagnosis not present

## 2023-08-18 DIAGNOSIS — D6869 Other thrombophilia: Secondary | ICD-10-CM | POA: Diagnosis not present

## 2023-08-18 NOTE — Patient Instructions (Signed)
 Medication Instructions:  STOP Multaq    Information about Tikosyn below.  *If you need a refill on your cardiac medications before your next appointment, please call your pharmacy*  Follow-Up: At [facility], you and your health needs are our priority.  As part of our continuing mission to provide you with exceptional heart care, our providers are all part of one team.  This team includes your primary Cardiologist (physician) and Advanced Practice Providers or APPs (Physician Assistants and Nurse Practitioners) who all work together to provide you with the care you need, when you need it.  Your next appointment:   3 month(s)  Provider:   Varney Gentleman, PA-C    We recommend signing up for the patient portal called "MyChart".  Sign up information is provided on this After Visit Summary.  MyChart is used to connect with patients for Virtual Visits (Telemedicine).  Patients are able to view lab/test results, encounter notes, upcoming appointments, etc.  Non-urgent messages can be sent to your provider as well.   To learn more about what you can do with MyChart, go to [email or web address].   Other Instructions   Dofetilide (Tikosyn) Hospital Admission  Preparing for admission:  Check with drug insurance company for cost of drug to ensure affordability --- Dofetilide 500 mcg twice a day.  GoodRx is an option if insurance copay is unaffordable. Patient assistance is also available.  A pharmacist will review all your medications for potential interactions with Tikosyn. If any medication changes are needed prior to admission we will be in touch with you.  If any new medications are started AFTER your admission date is set with Loyce Ruffini RN [phone]). Please notify our office immediately so your medication list can be updated and reviewed by our pharmacist again.  No Benadryl is allowed 3 days prior to admission.  Please ensure no missed doses of your anticoagulation (blood  thinner) for 3 weeks prior to admission. If a dose is missed please notify our office immediately.  If you are on coumadin/warfarin we will require 4 weekly therapeutic INR checks prior to admission.   On day of admission:  Afib Clinic office visit will be scheduled on the morning of admission for preliminary labs and EKG.  Please take your morning medications and have a normal breakfast prior to arrival to the appointment.     Tikosyn initiation requires a 3 night/4 day hospital stay with constant telemetry monitoring. You will have an EKG after each dose of Tikosyn as well as daily lab draws.  You may bring personal belongings/clothing with you to the hospital. Please leave your suitcase in the car until you arrive in admissions.  If the drug does not convert you to normal rhythm a cardioversion will be performed after the 4th dose of Tikosyn.   Time of admission is dependent on bed availability in the hospital. In some instances, you will be sent home until bed is available. Rarely admission can be delayed to the following day if hospital census prevents available beds.  Questions please call our office at [phone]

## 2023-08-18 NOTE — Progress Notes (Signed)
 Electrophysiology Clinic Note    Date:  08/18/2023  Patient ID:  Jonathon Evans., DOB Oct 21, 1960, MRN [id number] PCP:  Eartha Gold, MD  Cardiologist:  Constancia Delton, MD Electrophysiologist: Boyce Byes, MD   Discussed the use of AI scribe software for clinical note transcription with the patient, who gave verbal consent to proceed.   Patient Profile    Chief Complaint: Afib, DCCV follow-up  History of Present Illness: Jonathon Evans. is a 63 y.o. male with PMH notable for persis AFib, CVA, HTN, OSA on CPAP; seen today for Boyce Byes, MD for routine electrophysiology followup.   He was last seen by Dr. Marven Slimmer 08/2022 for consult of AFib. Chadsvasc was low at 0-1, so 81mg  ASA continued. Not ablation candidate d/t BMI.  He was admitted 04/2023 for a CVA, thought embolic iso AFib. He was discharged on eliquis .   I saw him in follow-up to discuss AAD, specifically tikosyn vs multaq . He wanted to trial multaq .  He is now s/p DCCV 4/23.   On follow-up today, he is not surprised to hear that he is not in normal rhythm. He did notice an improvement in his visual acuity when in normal rhythm. He says that his vision just seemed sharper, and this lasted for about 5-6 days after the DCCV. His wife also noticed that he did maintain a regular pulse for several days after DCCV. He has continued to take multaq  BID without missing doses.   He has adhered at a lot fat diet recently d/t cholelithiasis and has lost about 20 lbs. He is eating fresh fruit and vegetables, grilled fish and chicken. He does miss fried squash and fried okra.  Continues to take eliquis  BID, no bleeding concerns.    His wife joins for appt whom I have met before   AAD History: Multaq      ROS:  Please see the history of present illness. All other systems are reviewed and otherwise negative.    Physical Exam    VS:  BP 122/80 (BP Location: Left Arm, Patient Position: Sitting, Cuff Size:  Normal)   Pulse 80   Resp 16   Ht 6\' 1"  (1.854 m)   Wt (!) 325 lb 6.4 oz (147.6 kg)   SpO2 95%   BMI 42.93 kg/m  BMI: Body mass index is 42.93 kg/m.  Wt Readings from Last 3 Encounters:  08/18/23 (!) 325 lb 6.4 oz (147.6 kg)  08/03/23 (!) 327 lb 12.8 oz (148.7 kg)  07/26/23 (!) 335 lb (152 kg)     GEN- The patient is well appearing, alert and oriented x 3 today.   Lungs- Clear to ausculation bilaterally, normal work of breathing.  Heart- Irregularly irregular rate and rhythm, no murmurs, rubs or gallops Extremities- 1+ peripheral edema, warm, dry    Studies Reviewed   Previous EP, cardiology notes.    EKG is ordered. Personal review of EKG from today shows   EKG Interpretation Date/Time:  Thursday Aug 18 2023 14:26:07 EDT Ventricular Rate:  69 PR Interval:    QRS Duration:  148 QT Interval:  430 QTC Calculation: 460 R Axis:   -70  Text Interpretation: Atrial flutter with variable A-V block with premature ventricular or aberrantly conducted complexes Right bundle branch block Confirmed by Breeanna Galgano [phone]) on 08/18/2023 2:31:17 PM    08/03/2023 EKG - SR at 70bpm; RBBB, PR 200, QRS 161  QT 438 / QTC corrected to RBBB =  05/13/2023 AFib at 64. LAD, RBBB QRS QT ; QTC corrected for RBBB =   TTE, 04/25/2023  1. Left ventricular ejection fraction, by estimation, is 55 to 60%. The left ventricle has normal function. The left ventricle has no regional wall motion abnormalities. Left ventricular diastolic function could not be evaluated.   2. Right ventricular systolic function is normal. The right ventricular size is mildly enlarged. There is normal pulmonary artery systolic pressure. The estimated right ventricular systolic pressure is 14.2 mmHg.   3. Left atrial size was severely dilated.   4. Right atrial size was mildly dilated.   5. The mitral valve is grossly normal. Trivial mitral valve regurgitation. No evidence of mitral stenosis.   6. The  aortic valve is tricuspid. There is mild calcification of the aortic valve. Aortic valve regurgitation is not visualized. No aortic stenosis is present.   7. The inferior vena cava is normal in size with greater than 50% respiratory variability, suggesting right atrial pressure of 3 mmHg.   Comparison(s): No significant change from prior study.   TTE, 08/26/2022  1. Left ventricular ejection fraction, by estimation, is 55 to 60%. The left ventricle has normal function. The left ventricle has no regional wall motion abnormalities. Left ventricular diastolic parameters are indeterminate.   2. Right ventricular systolic function is mildly reduced. The right ventricular size is mildly enlarged. Tricuspid regurgitation signal is inadequate for assessing PA pressure.   3. Left atrial size was moderately dilated.   4. Right atrial size was moderately dilated.   5. The mitral valve is normal in structure. Mild to moderate mitral valve regurgitation. No evidence of mitral stenosis.   6. The aortic valve is normal in structure. Aortic valve regurgitation is not visualized. No aortic stenosis is present.   7. There is borderline dilatation of the aortic root, measuring 39 mm. There is mild dilatation of the ascending aorta, measuring 42 mm. There is  borderline dilatation of the aortic arch, measuring 36 mm.   8. The inferior vena cava is normal in size with greater than 50% respiratory variability, suggesting right atrial pressure of 3 mmHg.    Assessment and Plan     #) persis AFib No overt s/s of arrhythmia Not ablation candidate d/t BMI S/p DCCV with ERAF Discussed  tikosyn loading admission, which patient is agreeable to.  EKG with QT corrected for RBB = Stop multaq  Will msg AF clinic to coordinate tikosyn admission.    #) Hypercoag d/t persis afib #) h/o CVA CHA2DS2-VASc Score = at least 3 [CHF History: 0, HTN History: 1, Diabetes History: 0, Stroke History: 2, Vascular Disease  History: 0, Age Score: 0, Gender Score: 0].  Therefore, the patient's annual risk of stroke is 3.2 %.    Stroke ppx - 5mg  eliquis  BID, appropriately dosed No bleeding concerns            Current medicines are reviewed at length with the patient today.   The patient does not have concerns regarding his medicines.  The following changes were made today:   STOP multaq   Labs/ tests ordered today include:  Orders Placed This Encounter  Procedures   EKG 12-Lead     Disposition: Follow up with Dr. Marven Slimmer or EP APP  after Jonathon Evans loading      Signed, Jamey Harman, NP  08/18/23  3:12 PM  Electrophysiology [facility]

## 2023-08-19 ENCOUNTER — Telehealth: Payer: Self-pay | Admitting: Pharmacist

## 2023-08-19 NOTE — Telephone Encounter (Signed)
 Medication list reviewed in anticipation of upcoming Tikosyn initiation. Patient is not taking any contraindicated medications but is taking 1 QTc prolonging medications and one interacting medication.  Ondansetron : Is QTc prolonging and can increase the risk when used with Tikosyn. I would access how often patient is using this medication. Oral is lower risk than IV but ideally should be avoided all together.  Diltiazem : can increase the concentration of Tikosyn. Close monitoring for toxicity, including QTc is recommended.   Patient is anticoagulated on Eliquis  on the appropriate dose. Please ensure that patient has not missed any anticoagulation doses in the 3 weeks prior to Tikosyn initiation.   Patient will need to be counseled to avoid use of Benadryl while on Tikosyn and in the 2-3 days prior to Tikosyn initiation.

## 2023-08-26 NOTE — Telephone Encounter (Signed)
 Patient notified to stop zofran . Patient verbalized agreement.

## 2023-08-26 NOTE — Telephone Encounter (Signed)
 Left message

## 2023-08-30 ENCOUNTER — Ambulatory Visit: Payer: 59 | Admitting: Physician Assistant

## 2023-09-09 ENCOUNTER — Encounter (HOSPITAL_COMMUNITY): Payer: Self-pay

## 2023-09-09 ENCOUNTER — Telehealth (HOSPITAL_COMMUNITY): Payer: Self-pay

## 2023-09-09 NOTE — Telephone Encounter (Signed)
 Tikosyn admission has been approved for date of service: 09/13/2023. Authorization # [id number]

## 2023-09-12 DIAGNOSIS — G4733 Obstructive sleep apnea (adult) (pediatric): Secondary | ICD-10-CM | POA: Diagnosis not present

## 2023-09-13 ENCOUNTER — Ambulatory Visit (HOSPITAL_COMMUNITY): Payer: Self-pay | Admitting: Internal Medicine

## 2023-09-13 ENCOUNTER — Telehealth (HOSPITAL_COMMUNITY): Payer: Self-pay | Admitting: Pharmacy Technician

## 2023-09-13 ENCOUNTER — Other Ambulatory Visit: Payer: Self-pay

## 2023-09-13 ENCOUNTER — Other Ambulatory Visit (HOSPITAL_COMMUNITY): Payer: Self-pay

## 2023-09-13 ENCOUNTER — Inpatient Hospital Stay (HOSPITAL_COMMUNITY)
Admission: AD | Admit: 2023-09-13 | Discharge: 2023-09-16 | DRG: 309 | Disposition: A | Discharge status: Home / Self Care | Attending: Cardiology | Admitting: Cardiology

## 2023-09-13 ENCOUNTER — Ambulatory Visit (HOSPITAL_COMMUNITY)
Admission: RE | Admit: 2023-09-13 | Discharge: 2023-09-13 | Disposition: A | Discharge status: Home / Self Care | Source: Ambulatory Visit | Attending: Internal Medicine | Admitting: Internal Medicine

## 2023-09-13 ENCOUNTER — Encounter (HOSPITAL_COMMUNITY): Payer: Self-pay

## 2023-09-13 VITALS — BP 130/74 | HR 57 | Ht 73.0 in | Wt 324.8 lb

## 2023-09-13 DIAGNOSIS — E785 Hyperlipidemia, unspecified: Secondary | ICD-10-CM | POA: Diagnosis present

## 2023-09-13 DIAGNOSIS — K219 Gastro-esophageal reflux disease without esophagitis: Secondary | ICD-10-CM | POA: Diagnosis not present

## 2023-09-13 DIAGNOSIS — Z7901 Long term (current) use of anticoagulants: Secondary | ICD-10-CM | POA: Diagnosis not present

## 2023-09-13 DIAGNOSIS — E66813 Obesity, class 3: Secondary | ICD-10-CM | POA: Diagnosis not present

## 2023-09-13 DIAGNOSIS — D6869 Other thrombophilia: Secondary | ICD-10-CM | POA: Diagnosis present

## 2023-09-13 DIAGNOSIS — I10 Essential (primary) hypertension: Secondary | ICD-10-CM | POA: Diagnosis present

## 2023-09-13 DIAGNOSIS — I4891 Unspecified atrial fibrillation: Secondary | ICD-10-CM | POA: Diagnosis not present

## 2023-09-13 DIAGNOSIS — Z8673 Personal history of transient ischemic attack (TIA), and cerebral infarction without residual deficits: Secondary | ICD-10-CM | POA: Diagnosis not present

## 2023-09-13 DIAGNOSIS — I4819 Other persistent atrial fibrillation: Secondary | ICD-10-CM | POA: Diagnosis not present

## 2023-09-13 DIAGNOSIS — Z6841 Body Mass Index (BMI) 40.0 and over, adult: Secondary | ICD-10-CM

## 2023-09-13 DIAGNOSIS — I4510 Unspecified right bundle-branch block: Secondary | ICD-10-CM | POA: Diagnosis present

## 2023-09-13 DIAGNOSIS — R7303 Prediabetes: Secondary | ICD-10-CM | POA: Diagnosis not present

## 2023-09-13 DIAGNOSIS — I4892 Unspecified atrial flutter: Secondary | ICD-10-CM | POA: Diagnosis present

## 2023-09-13 DIAGNOSIS — G4733 Obstructive sleep apnea (adult) (pediatric): Secondary | ICD-10-CM | POA: Diagnosis not present

## 2023-09-13 LAB — BASIC METABOLIC PANEL WITH GFR
BUN/Creatinine Ratio: 13 (ref 10–24)
BUN: 13 mg/dL (ref 8–27)
CO2: 21 mmol/L (ref 20–29)
Calcium: 10 mg/dL (ref 8.6–10.2)
Chloride: 105 mmol/L (ref 96–106)
Creatinine, Ser: 0.99 mg/dL (ref 0.76–1.27)
Glucose: 101 mg/dL — ABNORMAL HIGH (ref 70–99)
Potassium: 4.7 mmol/L (ref 3.5–5.2)
Sodium: 143 mmol/L (ref 134–144)
eGFR: 85 mL/min/{1.73_m2} (ref >59)

## 2023-09-13 LAB — MAGNESIUM: Magnesium: 2 mg/dL (ref 1.6–2.3)

## 2023-09-13 MED ORDER — SODIUM CHLORIDE 0.9% FLUSH
10.0000 mL | Freq: Two times a day (BID) | INTRAVENOUS | Status: DC
  Administered 2023-09-13 – 2023-09-14 (×3): 10 mL
  Administered 2023-09-14: 20 mL
  Administered 2023-09-15 – 2023-09-16 (×3): 10 mL

## 2023-09-13 MED ORDER — APIXABAN 5 MG PO TABS
5.0000 mg | ORAL_TABLET | Freq: Two times a day (BID) | ORAL | Status: DC
  Administered 2023-09-13 – 2023-09-16 (×6): 5 mg via ORAL
  Filled 2023-09-13 (×6): qty 1

## 2023-09-13 MED ORDER — SODIUM CHLORIDE 0.9 % IV SOLN: 250.0000 mL | INTRAVENOUS | Status: AC | PRN

## 2023-09-13 MED ORDER — DILTIAZEM HCL ER COATED BEADS 120 MG PO CP24: 120.0000 mg | ORAL_CAPSULE | Freq: Every day | ORAL | Status: DC

## 2023-09-13 MED ORDER — CHLORHEXIDINE GLUCONATE CLOTH 2 % EX PADS
6.0000 | MEDICATED_PAD | Freq: Every day | CUTANEOUS | Status: DC
  Administered 2023-09-13: 6 via TOPICAL

## 2023-09-13 MED ORDER — LOSARTAN POTASSIUM 25 MG PO TABS
25.0000 mg | ORAL_TABLET | Freq: Every evening | ORAL | Status: DC
  Administered 2023-09-13 – 2023-09-15 (×3): 25 mg via ORAL
  Filled 2023-09-13 (×3): qty 1

## 2023-09-13 MED ORDER — SODIUM CHLORIDE 0.9% FLUSH: 3.0000 mL | INTRAVENOUS | Status: DC | PRN

## 2023-09-13 MED ORDER — PANTOPRAZOLE SODIUM 40 MG PO TBEC
40.0000 mg | DELAYED_RELEASE_TABLET | Freq: Every day | ORAL | Status: DC
  Administered 2023-09-14 – 2023-09-16 (×3): 40 mg via ORAL
  Filled 2023-09-13 (×3): qty 1

## 2023-09-13 MED ORDER — ROSUVASTATIN CALCIUM 5 MG PO TABS
10.0000 mg | ORAL_TABLET | Freq: Every day | ORAL | Status: DC
  Administered 2023-09-14 – 2023-09-16 (×3): 10 mg via ORAL
  Filled 2023-09-13 (×3): qty 2

## 2023-09-13 MED ORDER — SODIUM CHLORIDE 0.9% FLUSH
3.0000 mL | Freq: Two times a day (BID) | INTRAVENOUS | Status: DC
  Administered 2023-09-13: 3 mL via INTRAVENOUS

## 2023-09-13 MED ORDER — DOFETILIDE 500 MCG PO CAPS
500.0000 ug | ORAL_CAPSULE | Freq: Two times a day (BID) | ORAL | Status: DC
  Administered 2023-09-13 – 2023-09-16 (×6): 500 ug via ORAL
  Filled 2023-09-13 (×6): qty 1

## 2023-09-13 MED ORDER — OFF THE BEAT BOOK
Freq: Once | Status: AC
  Filled 2023-09-13: qty 1

## 2023-09-13 MED ORDER — MAGNESIUM SULFATE 2 GM/50ML IV SOLN
2.0000 g | Freq: Once | INTRAVENOUS | Status: AC
  Administered 2023-09-13: 2 g via INTRAVENOUS
  Filled 2023-09-13: qty 50

## 2023-09-13 NOTE — Progress Notes (Signed)
 Primary Care Physician: Eartha Gold, MD Primary Cardiologist: Constancia Delton, MD Electrophysiologist: Boyce Byes, MD     Referring Physician: Suzann Riddle, NP     Jonathon Evans. is a 63 y.o. male with a history of HTN, CVA, atrial flutter and atrial fibrillation who presents for consultation in the [facility]. ERAF with Multaq . Patient is on Eliquis  5 mg BID for a CHADS2VASC score of 3.  On evaluation today, patient is here for Tikosyn admission. He is in atrial flutter. He stopped Multaq  over 3 days prior to admission. No new medications since last OV. No benadryl use. No missed doses of anticoagulant.   Today, he denies symptoms of palpitations, chest pain, shortness of breath, orthopnea, PND, lower extremity edema, dizziness, presyncope, syncope, snoring, daytime somnolence, bleeding, or neurologic sequela. The patient is tolerating medications without difficulties and is otherwise without complaint today.    he has a BMI of Body mass index is 42.85 kg/m.Jonathon Evans Weights   09/13/23 0920  Weight: (!) 147.3 kg    Current Outpatient Medications  Medication Sig Dispense Refill   apixaban  (ELIQUIS ) 5 MG TABS tablet Take 1 tablet (5 mg total) by mouth 2 (two) times daily. 180 tablet 2   diltiazem  (CARDIZEM  CD) 120 MG 24 hr capsule Take 120 mg by mouth daily.     losartan  (COZAAR ) 25 MG tablet Take 25 mg by mouth every evening.     Multiple Vitamins-Minerals (MULTIVITAMIN WITH MINERALS) tablet Take 1 tablet by mouth daily.     Omega-3 Fatty Acids (FISH OIL PO) Take 1 capsule by mouth daily.     omeprazole  (PRILOSEC) 20 MG capsule Take 20 mg by mouth daily.     rosuvastatin  (CRESTOR ) 10 MG tablet Take 10 mg by mouth daily.     sildenafil (VIAGRA) 100 MG tablet Take 100 mg by mouth daily as needed for erectile dysfunction.     No current facility-administered medications for this encounter.    Atrial Fibrillation Management  history:  Previous antiarrhythmic drugs: Multaq  Previous cardioversions: 08/03/23 Previous ablations: none Anticoagulation history: Eliquis    ROS- All systems are reviewed and negative except as per the HPI above.  Physical Exam: BP 130/74   Pulse (!) 57   Ht 6\' 1"  (1.854 m)   Wt (!) 147.3 kg   BMI 42.85 kg/m   GEN: Well nourished, well developed in no acute distress NECK: No JVD; No carotid bruits CARDIAC: Irregularly irregular rate and rhythm, no murmurs, rubs, gallops RESPIRATORY:  Clear to auscultation without rales, wheezing or rhonchi  ABDOMEN: Soft, non-tender, non-distended EXTREMITIES:  No edema; No deformity   EKG today demonstrates  Vent. rate 57 BPM PR interval * ms QRS duration 150 ms QT/QTcB 424/412 ms P-R-T axes 245 -67 -16 Atrial flutter with variable A-V block Right bundle branch block Left anterior fascicular block Bifascicular block Abnormal ECG When compared with ECG of 18-Aug-2023 14:26, T wave inversion no longer evident in Anterior leads  Echo 04/25/23 demonstrated   1. Left ventricular ejection fraction, by estimation, is 55 to 60%. The  left ventricle has normal function. The left ventricle has no regional  wall motion abnormalities. Left ventricular diastolic function could not  be evaluated.   2. Right ventricular systolic function is normal. The right ventricular  size is mildly enlarged. There is normal pulmonary artery systolic  pressure. The estimated right ventricular systolic pressure is 14.2 mmHg.   3. Left atrial size was severely  dilated.   4. Right atrial size was mildly dilated.   5. The mitral valve is grossly normal. Trivial mitral valve  regurgitation. No evidence of mitral stenosis.   6. The aortic valve is tricuspid. There is mild calcification of the  aortic valve. Aortic valve regurgitation is not visualized. No aortic  stenosis is present.   7. The inferior vena cava is normal in size with greater than 50%  respiratory  variability, suggesting right atrial pressure of 3 mmHg.   ASSESSMENT & PLAN CHA2DS2-VASc Score = 3  The patient's score is based upon: CHF History: 0 HTN History: 1 Diabetes History: 0 Stroke History: 2 Vascular Disease History: 0 Age Score: 0 Gender Score: 0       ASSESSMENT AND PLAN: Persistent Atrial Fibrillation (ICD10:  I48.19) / atrial flutter The patient's CHA2DS2-VASc score is 3, indicating a 3.2% annual risk of stroke.    Patient presents for dofetilide admission. Continue Eliquis  5 mg BID, states no missed doses in the last 3 weeks. No recent benadryl use PharmD has screened medications QTc in SR 429 ms corrected Labs pending. CrCl from [facility] on 07/22/23 is 115 mL/min  Secondary Hypercoagulable State (ICD10:  D68.69) The patient is at significant risk for stroke/thromboembolism based upon his CHA2DS2-VASc Score of 3.  Continue Apixaban  (Eliquis ).  No missed doses.    Patient will present to admissions when a bed is available.   Jonathon Amber, PA-C  Afib Clinic [facility] [address] [place], [place] [address] [phone]

## 2023-09-13 NOTE — Progress Notes (Signed)

## 2023-09-13 NOTE — Telephone Encounter (Signed)
 Pharmacy Patient Advocate Encounter   Received notification from Inpatient Request that prior authorization for Dofetilide 500MCG capsules is required/requested.   Insurance verification completed.   The patient is insured through [employer] .   Per test claim: PA required; PA submitted to above mentioned insurance via CoverMyMeds Key/confirmation #/EOC [id number] Status is pending

## 2023-09-13 NOTE — Plan of Care (Signed)

## 2023-09-13 NOTE — Progress Notes (Signed)
 Pharmacy: Dofetilide (Tikosyn) - Initial Consult Assessment and Electrolyte Replacement  Pharmacy consulted to assist in monitoring and replacing electrolytes in this 63 y.o. male admitted on 09/13/2023 undergoing dofetilide initiation. First dofetilide dose: 500 mcg BID 6/3 PM  Assessment:  Patient Exclusion Criteria: If any screening criteria checked as "Yes", then  patient  should NOT receive dofetilide until criteria item is corrected.  If "Yes" please indicate correction plan.  YES  NO Patient  Exclusion Criteria Correction Plan/Comments   []   [x]   Baseline QTc interval is greater than or equal to 440 msec. IF above YES box checked dofetilide contraindicated unless patient has ICD; then may proceed if QTc 500-550 msec or with known ventricular conduction abnormalities may proceed with QTc 550-600 msec. QTc = 429 ms per EP    []   [x]   Patient is known or suspected to have a digoxin level greater than 2 ng/ml: No results found for: "DIGOXIN"     []   [x]   Creatinine clearance less than 20 ml/min (calculated using Cockcroft-Gault, actual body weight and serum creatinine): CrCl 157 ml/min      []   [x]  Patient has received drugs known to prolong the QT intervals within the last 48 hour (examples: phenothiazines, tricyclics or tetracyclic antidepressants, macrolides, 1st generation H-1 antihistamines (especially diphenhydramine), fluoroquinolones, azoles, ondansetron , metoclopramide, promethazine).   Updated information on QT prolonging agents is available to be searched on the following database:QT prolonging agents -If SSRI or antihistamine needed, preferred options are sertraline and loratadine respectively     []   [x]  Patient received a dose of a thiazide diuretic in the last 48 hours [including hydrochlorothiazide (Oretic) alone or in any combination including triamterene (Dyazide, Maxzide)].    []   [x]  Patient received a medication known to increase dofetilide plasma  concentrations prior to initial dofetilide dose:  Trimethoprim (Primsol, Proloprim) in the last 36 hours Verapamil (Calan, Verelan) in the last 36 hours or a sustained release dose in the last 72 hours Megestrol (Megace) in the last 5 days  Cimetidine (Tagamet) in the last 6 hours Ketoconazole (Nizoral) in the last 24 hours Itraconazole (Sporanox) in the last 48 hours  Prochlorperazine (Compazine) in the last 36 hours     []   [x]   Patient is known to have a history of torsades de pointes; congenital or acquired long QT syndromes.    []   [x]   Patient has received a Class 1 and Class 3 antiarrhythmic with less than 2 half-lives since last dose. (Disopyramide, Quinidine, Procainamide, Lidocaine , Mexiletine, Flecainide, Propafenone, Sotalol, Dronedarone ) Stopped Multaq  3d prior to admission   []   [x]   Patient has received amiodarone therapy in the past 3 months or amiodarone level is greater than 0.3 ng/ml.    Labs:    Component Value Date/Time   K 4.7 09/13/2023 1052   MG 2.0 09/13/2023 1052     Plan: Select One Calculated CrCl  Dose q12h  [x]  > 60 ml/min 500 mcg  []  40-60 ml/min 250 mcg  []  20-40 ml/min 125 mcg   [x]   Physician selected initial dose within range recommended for patients level of renal function - will monitor for response.  [x]   Physician selected initial dose outside of range recommended for patients level of renal function - will discuss if the dose should be altered at this time.   Patient has been appropriately anticoagulated with Eliquis .  Potassium: K >/= 4: Appropriate to initiate Tikosyn, no replacement needed    Magnesium: Mg 1.8-2: Give Mg 2  gm IV x1 to prevent Mg from dropping below 1.8 - do not need to recheck Mg. Appropriate to initiate Tikosyn  Tikosyn requires prior authorization - patient advocate team working on.   Thank you for allowing pharmacy to participate in this patient's care   Cecillia Cogan, PharmD Clinical  Pharmacist 09/13/2023  2:27 PM

## 2023-09-13 NOTE — H&P (Signed)
 Primary Care Physician: Eartha Gold, MD Primary Cardiologist: Constancia Delton, MD Electrophysiologist: Boyce Byes, MD     Referring Physician: Suzann Riddle, NP     Jonathon Evans. is a 63 y.o. male with a history of HTN, CVA, atrial flutter and atrial fibrillation who presents for consultation in the [facility]. ERAF with Multaq . Patient is on Eliquis  5 mg BID for a CHADS2VASC score of 3.  On evaluation today, patient is here for Tikosyn admission. He is in atrial flutter. He stopped Multaq  over 3 days prior to admission. No new medications since last OV. No benadryl use. No missed doses of anticoagulant.   Today, he denies symptoms of palpitations, chest pain, shortness of breath, orthopnea, PND, lower extremity edema, dizziness, presyncope, syncope, snoring, daytime somnolence, bleeding, or neurologic sequela. The patient is tolerating medications without difficulties and is otherwise without complaint today.    he has a BMI of Body mass index is 43.11 kg/m.[name] Filed Weights   09/13/23 1303  Weight: (!) 148.2 kg    Current Facility-Administered Medications  Medication Dose Route Frequency Provider Last Rate Last Admin   0.9 %  sodium chloride  infusion  250 mL Intravenous PRN Mealor, Augustus E, MD       apixaban  (ELIQUIS ) tablet 5 mg  5 mg Oral BID Mealor, Augustus E, MD       Chlorhexidine  Gluconate Cloth 2 % PADS 6 each  6 each Topical Daily Boyce Byes, MD       [START ON 09/14/2023] diltiazem  (CARDIZEM  CD) 24 hr capsule 120 mg  120 mg Oral Daily Suarez, Joseph J, PA-C       dofetilide (TIKOSYN) capsule 500 mcg  500 mcg Oral BID Mealor, Augustus E, MD       losartan  (COZAAR ) tablet 25 mg  25 mg Oral QPM Nathanel Bal, PA-C       magnesium sulfate IVPB 2 g 50 mL  2 g Intravenous Once Paytes, Emma U, RPH       [START ON 09/14/2023] pantoprazole  (PROTONIX ) EC tablet 40 mg  40 mg Oral Daily Suarez, Joseph J, PA-C       [START  ON 09/14/2023] rosuvastatin  (CRESTOR ) tablet 10 mg  10 mg Oral Daily Suarez, Joseph J, PA-C       sodium chloride  flush (NS) 0.9 % injection 10-40 mL  10-40 mL Intracatheter Q12H Boyce Byes, MD       sodium chloride  flush (NS) 0.9 % injection 3 mL  3 mL Intravenous Q12H Mealor, Augustus E, MD       sodium chloride  flush (NS) 0.9 % injection 3 mL  3 mL Intravenous PRN Mealor, Augustus E, MD        Atrial Fibrillation Management history:  Previous antiarrhythmic drugs: Multaq  Previous cardioversions: 08/03/23 Previous ablations: none Anticoagulation history: Eliquis    ROS- All systems are reviewed and negative except as per the HPI above.  Physical Exam: BP (!) 144/86 (BP Location: Left Wrist)   Pulse 84   Temp 98.1 F (36.7 C) (Oral)   Resp 16   Ht 6\' 1"  (1.854 m)   Wt (!) 148.2 kg   SpO2 98%   BMI 43.11 kg/m   GEN: Well nourished, well developed in no acute distress NECK: No JVD; No carotid bruits CARDIAC: Irregularly irregular rate and rhythm, no murmurs, rubs, gallops RESPIRATORY:  Clear to auscultation without rales, wheezing or rhonchi  ABDOMEN: Soft, non-tender, non-distended EXTREMITIES:  No  edema; No deformity   EKG today demonstrates  Vent. rate 57 BPM PR interval * ms QRS duration 150 ms QT/QTcB 424/412 ms P-R-T axes 245 -67 -16 Atrial flutter with variable A-V block Right bundle branch block Left anterior fascicular block Bifascicular block Abnormal ECG When compared with ECG of 18-Aug-2023 14:26, T wave inversion no longer evident in Anterior leads  Echo 04/25/23 demonstrated   1. Left ventricular ejection fraction, by estimation, is 55 to 60%. The  left ventricle has normal function. The left ventricle has no regional  wall motion abnormalities. Left ventricular diastolic function could not  be evaluated.   2. Right ventricular systolic function is normal. The right ventricular  size is mildly enlarged. There is normal pulmonary artery systolic   pressure. The estimated right ventricular systolic pressure is 14.2 mmHg.   3. Left atrial size was severely dilated.   4. Right atrial size was mildly dilated.   5. The mitral valve is grossly normal. Trivial mitral valve  regurgitation. No evidence of mitral stenosis.   6. The aortic valve is tricuspid. There is mild calcification of the  aortic valve. Aortic valve regurgitation is not visualized. No aortic  stenosis is present.   7. The inferior vena cava is normal in size with greater than 50%  respiratory variability, suggesting right atrial pressure of 3 mmHg.   ASSESSMENT & PLAN CHA2DS2-VASc Score = 3  The patient's score is based upon: CHF History: 0 HTN History: 1 Diabetes History: 0 Stroke History: 2 Vascular Disease History: 0 Age Score: 0 Gender Score: 0       ASSESSMENT AND PLAN: Persistent Atrial Fibrillation (ICD10:  I48.19) / atrial flutter The patient's CHA2DS2-VASc score is 3, indicating a 3.2% annual risk of stroke.    Patient presents for dofetilide admission. Continue Eliquis  5 mg BID, states no missed doses in the last 3 weeks. No recent benadryl use PharmD has screened medications QTc in SR 429 ms corrected Labs pending. CrCl from [facility] on 07/22/23 is 115 mL/min  Secondary Hypercoagulable State (ICD10:  D68.69) The patient is at significant risk for stroke/thromboembolism based upon his CHA2DS2-VASc Score of 3.  Continue Apixaban  (Eliquis ).  No missed doses.    Patient will present to admissions when a bed is available.    [facility] [address] [place], [place] [address] [phone]   ADDEND: AFib clinic visit to serve as H&P PMhx of CVA, HTN, OSA on CPAP and AFib Failed Multaq  with recurrent AFib > stopped 08/18/23, pt confirmed Zofran  was on med list > stopped 08/26/23 No current plans to pursue cholecystectomy, understand need to maintain uninterrupted OAC post CV/drug load Patient confirms he was not actively  taking Zofran , and can not take it with tikosyn  Eliquis  for a/c, Patient confirms no missed doses of his Eliquis  in 3 weeks or better Pt comes for Tikosyn load K+ 4.7 Mag 2.0 Creat 0.99 Planned for start  Mertha Abrahams, PA-C

## 2023-09-14 ENCOUNTER — Other Ambulatory Visit (HOSPITAL_COMMUNITY): Payer: Self-pay

## 2023-09-14 LAB — BASIC METABOLIC PANEL WITH GFR
Anion gap: 7 (ref 5–15)
BUN: 15 mg/dL (ref 8–23)
CO2: 26 mmol/L (ref 22–32)
Calcium: 9.1 mg/dL (ref 8.9–10.3)
Chloride: 106 mmol/L (ref 98–111)
Creatinine, Ser: 1.17 mg/dL (ref 0.61–1.24)
GFR, Estimated: >60 mL/min (ref >60)
Glucose, Bld: 123 mg/dL — ABNORMAL HIGH (ref 70–99)
Potassium: 3.9 mmol/L (ref 3.5–5.1)
Sodium: 139 mmol/L (ref 135–145)

## 2023-09-14 LAB — MAGNESIUM: Magnesium: 2 mg/dL (ref 1.7–2.4)

## 2023-09-14 MED ORDER — MAGNESIUM SULFATE 2 GM/50ML IV SOLN
2.0000 g | Freq: Once | INTRAVENOUS | Status: AC
  Administered 2023-09-14: 2 g via INTRAVENOUS
  Filled 2023-09-14: qty 50

## 2023-09-14 MED ORDER — POTASSIUM CHLORIDE CRYS ER 20 MEQ PO TBCR
20.0000 meq | EXTENDED_RELEASE_TABLET | Freq: Once | ORAL | Status: AC
  Administered 2023-09-14: 20 meq via ORAL
  Filled 2023-09-14: qty 1

## 2023-09-14 MED ORDER — SODIUM CHLORIDE 0.9% FLUSH: 3.0000 mL | Freq: Two times a day (BID) | INTRAVENOUS | Status: DC

## 2023-09-14 MED ORDER — SODIUM CHLORIDE 0.9% FLUSH: 3.0000 mL | INTRAVENOUS | Status: DC | PRN

## 2023-09-14 NOTE — TOC CM/SW Note (Signed)
 Transition of Care [facility]) - Inpatient Brief Assessment   Patient Details  Name: Jonathon Evans. MRN: [id number] Date of Birth: 11/17/1960  Transition of Care [facility]) CM/SW Contact:    Cosimo Diones, RN Phone Number: 09/14/2023, 10:06 AM   Clinical Narrative: Patient presented for Tikosyn Load. Case Manager spoke with the patient regarding co pay cost. Patient is agreeable to cost and would like to have the initial Rx filled via [facility] and the Rx refills 90 day supply escribed to [facility] in [place]. No further needs identified at this time.   Transition of Care Asessment: Insurance and Status: Insurance coverage has been reviewed Patient has primary care physician: Yes Home environment has been reviewed: reviewed Prior level of function:: independent Prior/Current Home Services: No current home services Social Drivers of Health Review: SDOH reviewed no interventions necessary Readmission risk has been reviewed: Yes Transition of care needs: no transition of care needs at this time

## 2023-09-14 NOTE — H&P (View-Only) (Signed)
 Rounding Note   Patient Name: Jonathon Evans. Date of Encounter: 09/14/2023  [facility] Cardiologist: Constancia Delton, MD   Subjective  Doing well  Scheduled Meds:  apixaban   5 mg Oral BID   diltiazem   120 mg Oral Daily   dofetilide  500 mcg Oral BID   losartan   25 mg Oral QPM   off the beat book   Does not apply Once   pantoprazole   40 mg Oral Daily   rosuvastatin   10 mg Oral Daily   sodium chloride  flush  10-40 mL Intracatheter Q12H   sodium chloride  flush  3 mL Intravenous Q12H   Continuous Infusions:  sodium chloride      PRN Meds: sodium chloride , sodium chloride  flush   Vital Signs  Vitals:   09/13/23 2058 09/13/23 2344 09/14/23 0420 09/14/23 0731  BP: 135/77 117/74 119/63 (!) 146/86  Pulse: (!) 45  69 (!) 51  Resp: 18 18 19    Temp: 98.1 F (36.7 C) 98.5 F (36.9 C)  97.9 F (36.6 C)  TempSrc: Oral Axillary  Oral  SpO2: 98%  98% 95%  Weight:      Height:        Intake/Output Summary (Last 24 hours) at 09/14/2023 0735 Last data filed at 09/13/2023 2200 Gross per 24 hour  Intake 1300 ml  Output --  Net 1300 ml      09/13/2023    1:03 PM 09/13/2023    9:20 AM 08/18/2023    2:28 PM  Last 3 Weights  Weight (lbs) 326 lb 11.6 oz 324 lb 12.8 oz 325 lb 6.4 oz  Weight (kg) 148.2 kg 147.328 kg 147.6 kg      Telemetry AFlutter 39-40s overnight, 50's currently - Personally Reviewed  ECG  AFlutter 50bpm, RBBB, QTc 428 (not accounting for his QRS) - Personally Reviewed  Physical Exam  GEN: No acute distress.   Neck: No JVD Cardiac: irreg-irreg, no murmurs, rubs, or gallops.  Respiratory: CTA b/l GI: Soft, nontender, non-distended  MS: No edema; No deformity. Neuro:  Nonfocal  Psych: Normal affect   Labs High Sensitivity Troponin:  No results for input(s): "TROPONINIHS" in the last 720 hours.   Chemistry Recent Labs  Lab 09/13/23 1052 09/14/23 0651  NA 143 139  K 4.7 3.9  CL 105 106  CO2 21 26  GLUCOSE 101* 123*  BUN 13 15   CREATININE 0.99 1.17  CALCIUM  10.0 9.1  MG 2.0 2.0  GFRNONAA  --  >60  ANIONGAP  --  7    Lipids No results for input(s): "CHOL", "TRIG", "HDL", "LABVLDL", "LDLCALC", "CHOLHDL" in the last 168 hours.  HematologyNo results for input(s): "WBC", "RBC", "HGB", "HCT", "MCV", "MCH", "MCHC", "RDW", "PLT" in the last 168 hours. Thyroid No results for input(s): "TSH", "FREET4" in the last 168 hours.  BNPNo results for input(s): "BNP", "PROBNP" in the last 168 hours.  DDimer No results for input(s): "DDIMER" in the last 168 hours.   Radiology  No results found.  Cardiac Studies  Echo 04/25/23   1. Left ventricular ejection fraction, by estimation, is 55 to 60%. The  left ventricle has normal function. The left ventricle has no regional  wall motion abnormalities. Left ventricular diastolic function could not  be evaluated.   2. Right ventricular systolic function is normal. The right ventricular  size is mildly enlarged. There is normal pulmonary artery systolic  pressure. The estimated right ventricular systolic pressure is 14.2 mmHg.   3. Left atrial  size was severely dilated.   4. Right atrial size was mildly dilated.   5. The mitral valve is grossly normal. Trivial mitral valve  regurgitation. No evidence of mitral stenosis.   6. The aortic valve is tricuspid. There is mild calcification of the  aortic valve. Aortic valve regurgitation is not visualized. No aortic  stenosis is present.   7. The inferior vena cava is normal in size with greater than 50%  respiratory variability, suggesting right atrial pressure of 3 mmHg.   Patient Profile   63 y.o. male w/PMHx of  CVA, HTN, OSA on CPAP and AFib  Admitted for tikosyn   Assessment & Plan   Persistent AFib CHA2DS2Vasc is 3, on Eliquis , appropriately dosed Tikosyn load is in progress K+ 3.9 (replacement w/RPH per protocol) Mag 2.0 Creat 1.17 QTc stable  DCCV tomorrow if not in SR, pt is aware and agreeable Informed Consent    Shared Decision Making/Informed Consent The risks (stroke, cardiac arrhythmias rarely resulting in the need for a temporary or permanent pacemaker, skin irritation or burns and complications associated with conscious sedation including aspiration, arrhythmia, respiratory failure and death), benefits (restoration of normal sinus rhythm) and alternatives of a direct current cardioversion were explained in detail to Mr. Delamater and he agrees to proceed.   Given tendency towards brady > will hold dilt today in anticipation of CV w/drug vs DCCV tomorrow to try and avoid marked SB  HTN Continue losartan  (holding dilt as above)  OSA Brought his home CPAP   For questions or updates, please contact [facility] Please consult [email or web address] for contact info under     Signed, Debbie Fails, PA-C  09/14/2023, 7:35 AM

## 2023-09-14 NOTE — Anesthesia Preprocedure Evaluation (Signed)
 Anesthesia Evaluation  Patient identified by MRN, date of birth, ID band Patient awake    Reviewed: Allergy & Precautions, H&P , NPO status , Patient's Chart, lab work & pertinent test results, reviewed documented beta blocker date and time   Airway Mallampati: II   Neck ROM: full    Dental  (+) Poor Dentition   Pulmonary sleep apnea    Pulmonary exam normal        Cardiovascular (-) Past MI + dysrhythmias Atrial Fibrillation  Rhythm:regular Rate:Normal  IMPRESSIONS     1. Left ventricular ejection fraction, by estimation, is 55 to 60%. The  left ventricle has normal function. The left ventricle has no regional  wall motion abnormalities. Left ventricular diastolic function could not  be evaluated.   2. Right ventricular systolic function is normal. The right ventricular  size is mildly enlarged. There is normal pulmonary artery systolic  pressure. The estimated right ventricular systolic pressure is 14.2 mmHg.   3. Left atrial size was severely dilated.   4. Right atrial size was mildly dilated.   5. The mitral valve is grossly normal. Trivial mitral valve  regurgitation. No evidence of mitral stenosis.   6. The aortic valve is tricuspid. There is mild calcification of the  aortic valve. Aortic valve regurgitation is not visualized. No aortic  stenosis is present.   7. The inferior vena cava is normal in size with greater than 50%  respiratory variability, suggesting right atrial pressure of 3 mmHg.   Comparison(s): No significant change from prior study.     Neuro/Psych CVA, Residual Symptoms  negative psych ROS   GI/Hepatic Neg liver ROS,GERD  Medicated,,  Endo/Other    Class 3 obesity  Renal/GU negative Renal ROS  negative genitourinary   Musculoskeletal  (+) Arthritis ,    Abdominal   Peds  Hematology negative hematology ROS (+)   Anesthesia Other Findings   Reproductive/Obstetrics negative OB  ROS                              Anesthesia Physical Anesthesia Plan  ASA: 3  Anesthesia Plan: General   Post-op Pain Management: Minimal or no pain anticipated   Induction:   PONV Risk Score and Plan: 2 and Treatment may vary due to age or medical condition  Airway Management Planned: Natural Airway and Simple Face Mask  Additional Equipment: None  Intra-op Plan:   Post-operative Plan:   Informed Consent: I have reviewed the patients History and Physical, chart, labs and discussed the procedure including the risks, benefits and alternatives for the proposed anesthesia with the patient or authorized representative who has indicated his/her understanding and acceptance.     Dental Advisory Given  Plan Discussed with: CRNA  Anesthesia Plan Comments:         Anesthesia Quick Evaluation

## 2023-09-14 NOTE — Progress Notes (Signed)
 Pharmacy: Dofetilide (Tikosyn) - Follow Up Assessment and Electrolyte Replacement  Pharmacy consulted to assist in monitoring and replacing electrolytes in this 63 y.o. male admitted on 09/13/2023 undergoing dofetilide initiation.   Labs:    Component Value Date/Time   K 3.9 09/14/2023 0651   MG 2.0 09/14/2023 1610     Plan: Potassium: -Kdur 20meq xq  Magnesium: Mg 1.8-2: Give Mg 2 gm IV x1    Thank you for allowing pharmacy to participate in this patient's care   Baxter Limber, PharmD Clinical Pharmacist **Pharmacist phone directory can now be found on [email or web address] ([email or web address]).  Listed under [facility].

## 2023-09-14 NOTE — Progress Notes (Signed)
 Post dose EKG is reviewed AFlutter 59bpm, QTc is difficult to measure well, read as Lead I is most clear, QTc especially given QRS duration remains stable Continue Tikosyn load  Jax Kentner, PA-C

## 2023-09-14 NOTE — Telephone Encounter (Signed)
 Pharmacy Patient Advocate Encounter  Received notification from [employer] that Prior Authorization for  Dofetilide 500MCG capsules  has been APPROVED from 09/13/2023 to 09/12/2024. Ran test claim, Copay is $0.00. This test claim was processed through [facility]- copay amounts may vary at other pharmacies due to pharmacy/plan contracts, or as the patient moves through the different stages of their insurance plan.   PA #/Case ID/Reference #: [id number]

## 2023-09-14 NOTE — Progress Notes (Signed)
 Rounding Note   Patient Name: Jonathon Evans. Date of Encounter: 09/14/2023  [facility] Cardiologist: Constancia Delton, MD   Subjective  Doing well  Scheduled Meds:  apixaban   5 mg Oral BID   diltiazem   120 mg Oral Daily   dofetilide  500 mcg Oral BID   losartan   25 mg Oral QPM   off the beat book   Does not apply Once   pantoprazole   40 mg Oral Daily   rosuvastatin   10 mg Oral Daily   sodium chloride  flush  10-40 mL Intracatheter Q12H   sodium chloride  flush  3 mL Intravenous Q12H   Continuous Infusions:  sodium chloride      PRN Meds: sodium chloride , sodium chloride  flush   Vital Signs  Vitals:   09/13/23 2058 09/13/23 2344 09/14/23 0420 09/14/23 0731  BP: 135/77 117/74 119/63 (!) 146/86  Pulse: (!) 45  69 (!) 51  Resp: 18 18 19    Temp: 98.1 F (36.7 C) 98.5 F (36.9 C)  97.9 F (36.6 C)  TempSrc: Oral Axillary  Oral  SpO2: 98%  98% 95%  Weight:      Height:        Intake/Output Summary (Last 24 hours) at 09/14/2023 0735 Last data filed at 09/13/2023 2200 Gross per 24 hour  Intake 1300 ml  Output --  Net 1300 ml      09/13/2023    1:03 PM 09/13/2023    9:20 AM 08/18/2023    2:28 PM  Last 3 Weights  Weight (lbs) 326 lb 11.6 oz 324 lb 12.8 oz 325 lb 6.4 oz  Weight (kg) 148.2 kg 147.328 kg 147.6 kg      Telemetry AFlutter 39-40s overnight, 50's currently - Personally Reviewed  ECG  AFlutter 50bpm, RBBB, QTc 428 (not accounting for his QRS) - Personally Reviewed  Physical Exam  GEN: No acute distress.   Neck: No JVD Cardiac: irreg-irreg, no murmurs, rubs, or gallops.  Respiratory: CTA b/l GI: Soft, nontender, non-distended  MS: No edema; No deformity. Neuro:  Nonfocal  Psych: Normal affect   Labs High Sensitivity Troponin:  No results for input(s): "TROPONINIHS" in the last 720 hours.   Chemistry Recent Labs  Lab 09/13/23 1052 09/14/23 0651  NA 143 139  K 4.7 3.9  CL 105 106  CO2 21 26  GLUCOSE 101* 123*  BUN 13 15   CREATININE 0.99 1.17  CALCIUM  10.0 9.1  MG 2.0 2.0  GFRNONAA  --  >60  ANIONGAP  --  7    Lipids No results for input(s): "CHOL", "TRIG", "HDL", "LABVLDL", "LDLCALC", "CHOLHDL" in the last 168 hours.  HematologyNo results for input(s): "WBC", "RBC", "HGB", "HCT", "MCV", "MCH", "MCHC", "RDW", "PLT" in the last 168 hours. Thyroid No results for input(s): "TSH", "FREET4" in the last 168 hours.  BNPNo results for input(s): "BNP", "PROBNP" in the last 168 hours.  DDimer No results for input(s): "DDIMER" in the last 168 hours.   Radiology  No results found.  Cardiac Studies  Echo 04/25/23   1. Left ventricular ejection fraction, by estimation, is 55 to 60%. The  left ventricle has normal function. The left ventricle has no regional  wall motion abnormalities. Left ventricular diastolic function could not  be evaluated.   2. Right ventricular systolic function is normal. The right ventricular  size is mildly enlarged. There is normal pulmonary artery systolic  pressure. The estimated right ventricular systolic pressure is 14.2 mmHg.   3. Left atrial  size was severely dilated.   4. Right atrial size was mildly dilated.   5. The mitral valve is grossly normal. Trivial mitral valve  regurgitation. No evidence of mitral stenosis.   6. The aortic valve is tricuspid. There is mild calcification of the  aortic valve. Aortic valve regurgitation is not visualized. No aortic  stenosis is present.   7. The inferior vena cava is normal in size with greater than 50%  respiratory variability, suggesting right atrial pressure of 3 mmHg.   Patient Profile   63 y.o. male w/PMHx of  CVA, HTN, OSA on CPAP and AFib  Admitted for tikosyn   Assessment & Plan   Persistent AFib CHA2DS2Vasc is 3, on Eliquis , appropriately dosed Tikosyn load is in progress K+ 3.9 (replacement w/RPH per protocol) Mag 2.0 Creat 1.17 QTc stable  DCCV tomorrow if not in SR, pt is aware and agreeable Informed Consent    Shared Decision Making/Informed Consent The risks (stroke, cardiac arrhythmias rarely resulting in the need for a temporary or permanent pacemaker, skin irritation or burns and complications associated with conscious sedation including aspiration, arrhythmia, respiratory failure and death), benefits (restoration of normal sinus rhythm) and alternatives of a direct current cardioversion were explained in detail to Mr. Delamater and he agrees to proceed.   Given tendency towards brady > will hold dilt today in anticipation of CV w/drug vs DCCV tomorrow to try and avoid marked SB  HTN Continue losartan  (holding dilt as above)  OSA Brought his home CPAP   For questions or updates, please contact [facility] Please consult [email or web address] for contact info under     Signed, Debbie Fails, PA-C  09/14/2023, 7:35 AM

## 2023-09-15 ENCOUNTER — Encounter (HOSPITAL_COMMUNITY)
Admission: AD | Disposition: A | Discharge status: Home / Self Care | Payer: Self-pay | Source: Home / Self Care | Attending: Cardiology

## 2023-09-15 ENCOUNTER — Encounter (HOSPITAL_COMMUNITY): Payer: Self-pay | Admitting: Cardiology

## 2023-09-15 ENCOUNTER — Inpatient Hospital Stay (HOSPITAL_COMMUNITY): Payer: Self-pay

## 2023-09-15 DIAGNOSIS — I4891 Unspecified atrial fibrillation: Secondary | ICD-10-CM | POA: Diagnosis not present

## 2023-09-15 DIAGNOSIS — Z6841 Body Mass Index (BMI) 40.0 and over, adult: Secondary | ICD-10-CM

## 2023-09-15 DIAGNOSIS — E66813 Obesity, class 3: Secondary | ICD-10-CM | POA: Diagnosis not present

## 2023-09-15 DIAGNOSIS — G4733 Obstructive sleep apnea (adult) (pediatric): Secondary | ICD-10-CM | POA: Diagnosis not present

## 2023-09-15 HISTORY — PX: CARDIOVERSION: EP1203

## 2023-09-15 LAB — CBC
HCT: 46.3 % (ref 39.0–52.0)
Hemoglobin: 16 g/dL (ref 13.0–17.0)
MCH: 29.2 pg (ref 26.0–34.0)
MCHC: 34.6 g/dL (ref 30.0–36.0)
MCV: 84.5 fL (ref 80.0–100.0)
Platelets: 127 10*3/uL — ABNORMAL LOW (ref 150–400)
RBC: 5.48 MIL/uL (ref 4.22–5.81)
RDW: 13.3 % (ref 11.5–15.5)
WBC: 7.3 10*3/uL (ref 4.0–10.5)
nRBC: 0 % (ref 0.0–0.2)

## 2023-09-15 LAB — BASIC METABOLIC PANEL WITH GFR
Anion gap: 9 (ref 5–15)
BUN: 19 mg/dL (ref 8–23)
CO2: 22 mmol/L (ref 22–32)
Calcium: 9.6 mg/dL (ref 8.9–10.3)
Chloride: 109 mmol/L (ref 98–111)
Creatinine, Ser: 1.31 mg/dL — ABNORMAL HIGH (ref 0.61–1.24)
GFR, Estimated: >60 mL/min (ref >60)
Glucose, Bld: 109 mg/dL — ABNORMAL HIGH (ref 70–99)
Potassium: 4.1 mmol/L (ref 3.5–5.1)
Sodium: 140 mmol/L (ref 135–145)

## 2023-09-15 LAB — MAGNESIUM: Magnesium: 2 mg/dL (ref 1.7–2.4)

## 2023-09-15 SURGERY — CARDIOVERSION (CATH LAB)
Anesthesia: General

## 2023-09-15 MED ORDER — PROPOFOL 10 MG/ML IV BOLUS
INTRAVENOUS | Status: DC | PRN
  Administered 2023-09-15: 70 mg via INTRAVENOUS

## 2023-09-15 MED ORDER — MAGNESIUM SULFATE 2 GM/50ML IV SOLN
2.0000 g | Freq: Once | INTRAVENOUS | Status: AC
  Administered 2023-09-15: 2 g via INTRAVENOUS
  Filled 2023-09-15: qty 50

## 2023-09-15 SURGICAL SUPPLY — 1 items: PAD DEFIB RADIO PHYSIO CONN (PAD) ×1 IMPLANT

## 2023-09-15 NOTE — CV Procedure (Signed)
   DIRECT CURRENT CARDIOVERSION  NAME:  Jonathon Evans.    MRN: [id number] DOB:  [birth year]    ADMIT DATE: 09/13/2023  Indication:  Symptomatic atrial fibrillation / flutter  Procedure Note:  The patient signed informed consent.  They have had had therapeutic anticoagulation with Eliquis  greater than 3 weeks.  Anesthesia was administered by Dr. Elby Green.  Adequate airway was maintained throughout and vital followed per protocol.  They were cardioverted x 1 with 200J of biphasic synchronized energy.  They converted to NSR.  There were no apparent complications.  The patient had normal neuro status and respiratory status post procedure with vitals stable as recorded elsewhere.    Follow up: They will continue on current medical therapy and follow up with cardiology as scheduled.  Olinda Bertrand, DO, [facility]  [facility]  [address] [place], [place] [address] [phone] 7:57 AM

## 2023-09-15 NOTE — Progress Notes (Signed)
 Rounding Note   Patient Name: Jonathon Evans. Date of Encounter: 09/15/2023  [facility] Cardiologist: Constancia Delton, MD   Subjective  Doing well, heading to DCCV  Scheduled Meds:  apixaban   5 mg Oral BID   dofetilide  500 mcg Oral BID   losartan   25 mg Oral QPM   pantoprazole   40 mg Oral Daily   rosuvastatin   10 mg Oral Daily   sodium chloride  flush  10-40 mL Intracatheter Q12H   sodium chloride  flush  3 mL Intravenous Q12H   sodium chloride  flush  3-10 mL Intravenous Q12H   Continuous Infusions:   PRN Meds: sodium chloride  flush, sodium chloride  flush   Vital Signs  Vitals:   09/14/23 1710 09/14/23 2004 09/14/23 2321 09/15/23 0420  BP: 126/83 (!) 148/96 118/71 (!) 169/99  Pulse: (!) 44 65 (!) 57 (!) 58  Resp:  20 16 18   Temp: 98.3 F (36.8 C) 98.4 F (36.9 C) 98.3 F (36.8 C) 97.6 F (36.4 C)  TempSrc: Oral Oral Oral Oral  SpO2: 96% 97% 100% 98%  Weight:      Height:        Intake/Output Summary (Last 24 hours) at 09/15/2023 0721 Last data filed at 09/14/2023 2159 Gross per 24 hour  Intake 550 ml  Output --  Net 550 ml      09/13/2023    1:03 PM 09/13/2023    9:20 AM 08/18/2023    2:28 PM  Last 3 Weights  Weight (lbs) 326 lb 11.6 oz 324 lb 12.8 oz 325 lb 6.4 oz  Weight (kg) 148.2 kg 147.328 kg 147.6 kg      Telemetry unchanged AFlutter 39-40s overnight, 50's currently - Personally Reviewed  ECG  AFlutter 54bpm, RBBB, LAD, QTc 428 (not accounting for his QRS) - Personally Reviewed  Physical Exam  Unchanged from yesterday GEN: No acute distress.   Neck: No JVD Cardiac: irreg-irreg, no murmurs, rubs, or gallops.  Respiratory: CTA b/l GI: Soft, nontender, non-distended  MS: No edema; No deformity. Neuro:  Nonfocal  Psych: Normal affect   Labs High Sensitivity Troponin:  No results for input(s): "TROPONINIHS" in the last 720 hours.   Chemistry Recent Labs  Lab 09/13/23 1052 09/14/23 0651 09/15/23 0421  NA 143 139 140  K 4.7  3.9 4.1  CL 105 106 109  CO2 21 26 22   GLUCOSE 101* 123* 109*  BUN 13 15 19   CREATININE 0.99 1.17 1.31*  CALCIUM  10.0 9.1 9.6  MG 2.0 2.0 2.0  GFRNONAA  --  >60 >60  ANIONGAP  --  7 9    Lipids No results for input(s): "CHOL", "TRIG", "HDL", "LABVLDL", "LDLCALC", "CHOLHDL" in the last 168 hours.  Hematology Recent Labs  Lab 09/15/23 0421  WBC 7.3  RBC 5.48  HGB 16.0  HCT 46.3  MCV 84.5  MCH 29.2  MCHC 34.6  RDW 13.3  PLT 127*   Thyroid No results for input(s): "TSH", "FREET4" in the last 168 hours.  BNPNo results for input(s): "BNP", "PROBNP" in the last 168 hours.  DDimer No results for input(s): "DDIMER" in the last 168 hours.   Radiology  No results found.  Cardiac Studies  Echo 04/25/23   1. Left ventricular ejection fraction, by estimation, is 55 to 60%. The  left ventricle has normal function. The left ventricle has no regional  wall motion abnormalities. Left ventricular diastolic function could not  be evaluated.   2. Right ventricular systolic function is normal.  The right ventricular  size is mildly enlarged. There is normal pulmonary artery systolic  pressure. The estimated right ventricular systolic pressure is 14.2 mmHg.   3. Left atrial size was severely dilated.   4. Right atrial size was mildly dilated.   5. The mitral valve is grossly normal. Trivial mitral valve  regurgitation. No evidence of mitral stenosis.   6. The aortic valve is tricuspid. There is mild calcification of the  aortic valve. Aortic valve regurgitation is not visualized. No aortic  stenosis is present.   7. The inferior vena cava is normal in size with greater than 50%  respiratory variability, suggesting right atrial pressure of 3 mmHg.   Patient Profile   63 y.o. male w/PMHx of  CVA, HTN, OSA on CPAP and AFib  Admitted for tikosyn   Assessment & Plan   Persistent AFib CHA2DS2Vasc is 3, on Eliquis , appropriately dosed Tikosyn load is in progress K+ 4.1 Mag 2.0 Creat  1.31 QTc stable  DCCV tomorrow if not in SR, pt is aware and agreeable Informed Consent   Shared Decision Making/Informed Consent The risks (stroke, cardiac arrhythmias rarely resulting in the need for a temporary or permanent pacemaker, skin irritation or burns and complications associated with conscious sedation including aspiration, arrhythmia, respiratory failure and death), benefits (restoration of normal sinus rhythm) and alternatives of a direct current cardioversion were explained in detail to Jonathon Evans and he agrees to proceed.   Given tendency towards brady > held dilt yesterday in anticipation of CV w/drug vs DCCV to try and avoid marked SB post conversion  HTN Continue losartan  (holding dilt as above) Will revisit post DCCV  OSA Brought his home CPAP   For questions or updates, please contact [facility] Please consult [email or web address] for contact info under     Signed, Debbie Fails, PA-C  09/15/2023, 7:21 AM

## 2023-09-15 NOTE — Transfer of Care (Signed)
 Immediate Anesthesia Transfer of Care Note  Patient: Jonathon Evans.  Procedure(s) Performed: CARDIOVERSION  Patient Location: PACU  Anesthesia Type:MAC  Level of Consciousness: awake and drowsy  Airway & Oxygen Therapy: Patient Spontanous Breathing and Patient connected to nasal cannula oxygen  Post-op Assessment: Report given to RN and Post -op Vital signs reviewed and stable  Post vital signs: Reviewed and stable  Last Vitals:  Vitals Value Taken Time  BP    Temp    Pulse    Resp    SpO2      Last Pain:  Vitals:   09/15/23 0723  TempSrc:   PainSc: 0-No pain         Complications: No notable events documented.

## 2023-09-15 NOTE — Plan of Care (Signed)
   Problem: Education: Goal: Knowledge of General Education information will improve Description Including pain rating scale, medication(s)/side effects and non-pharmacologic comfort measures Outcome: Progressing

## 2023-09-15 NOTE — Interval H&P Note (Signed)
 History and Physical Interval Note:  09/15/2023 7:41 AM  Jonathon Evans.  has presented today for surgery, with the diagnosis of afib.  The various methods of treatment have been discussed with the patient and family. After consideration of risks, benefits and other options for treatment, the patient has consented to  Procedure(s): CARDIOVERSION (N/A) as a surgical intervention.  The patient's history has been reviewed, patient examined, no change in status, stable for surgery.  I have reviewed the patient's chart and labs.  Questions were answered to the patient's satisfaction.    No missed doses of Eliquis  for the last 3 week atleast.  Contact person: Marcell Serene   Informed Consent   Shared Decision Making/Informed Consent The risks (stroke, cardiac arrhythmias rarely resulting in the need for a temporary or permanent pacemaker, skin irritation or burns and complications associated with conscious sedation including aspiration, arrhythmia, respiratory failure and death), benefits (restoration of normal sinus rhythm) and alternatives of a direct current cardioversion were explained in detail to Mr. Funari and he agrees to proceed.      Awilda Bogus, [facility]  A Division of [facility] [address], [place], [place] [address]  [place], [place] [address] 7:42 AM

## 2023-09-15 NOTE — Progress Notes (Signed)
 Post dose EKG is reviewed. SR 69bpm, RBBB, manually measured QT , QTc , given QRS duration , QTc remains acceptable Continue Tikosyn  Mertha Abrahams, PA-C

## 2023-09-15 NOTE — Anesthesia Postprocedure Evaluation (Signed)
 Anesthesia Post Note  Patient: Asiah Edward Guthmiller.  Procedure(s) Performed: CARDIOVERSION     Patient location during evaluation: Cath Lab Anesthesia Type: General Level of consciousness: awake and alert Pain management: pain level controlled Vital Signs Assessment: post-procedure vital signs reviewed and stable Respiratory status: spontaneous breathing, nonlabored ventilation, respiratory function stable and patient connected to nasal cannula oxygen Cardiovascular status: blood pressure returned to baseline and stable Postop Assessment: no apparent nausea or vomiting Anesthetic complications: no   No notable events documented.  Last Vitals:  Vitals:   09/15/23 0818 09/15/23 0838  BP: (!) 157/94 (!) 152/91  Pulse: 64 61  Resp: 19 18  Temp:  36.6 C  SpO2: 99% 98%    Last Pain:  Vitals:   09/15/23 0838  TempSrc: Oral  PainSc:                  Lethaniel Rave

## 2023-09-15 NOTE — Progress Notes (Signed)
 Pharmacy: Dofetilide (Tikosyn) - Follow Up Assessment and Electrolyte Replacement  Pharmacy consulted to assist in monitoring and replacing electrolytes in this 63 y.o. male admitted on 09/13/2023 undergoing dofetilide initiation  Labs:    Component Value Date/Time   K 4.1 09/15/2023 0421   MG 2.0 09/15/2023 0421     Plan: Potassium: K >/= 4: No additional supplementation needed  Magnesium: Mg 1.8-2: Give Mg 2 gm IV x1    As patient has required only 20meq of oral potassium since 6/3, I anticipate no need for oral potassium at discharge  Thank you for allowing pharmacy to participate in this patient's care    Baxter Limber, PharmD Clinical Pharmacist **Pharmacist phone directory can now be found on [email or web address] ([email or web address]).  Listed under [facility].

## 2023-09-16 ENCOUNTER — Other Ambulatory Visit (HOSPITAL_COMMUNITY): Payer: Self-pay

## 2023-09-16 LAB — BASIC METABOLIC PANEL WITH GFR
Anion gap: 11 (ref 5–15)
BUN: 15 mg/dL (ref 8–23)
CO2: 26 mmol/L (ref 22–32)
Calcium: 9.8 mg/dL (ref 8.9–10.3)
Chloride: 105 mmol/L (ref 98–111)
Creatinine, Ser: 1.2 mg/dL (ref 0.61–1.24)
GFR, Estimated: >60 mL/min (ref >60)
Glucose, Bld: 105 mg/dL — ABNORMAL HIGH (ref 70–99)
Potassium: 4.2 mmol/L (ref 3.5–5.1)
Sodium: 142 mmol/L (ref 135–145)

## 2023-09-16 LAB — MAGNESIUM: Magnesium: 2.1 mg/dL (ref 1.7–2.4)

## 2023-09-16 MED ORDER — DOFETILIDE 500 MCG PO CAPS
500.0000 ug | ORAL_CAPSULE | Freq: Two times a day (BID) | ORAL | 5 refills | Status: DC
  Filled 2023-09-16: qty 60, 30d supply, fill #0

## 2023-09-16 NOTE — Progress Notes (Signed)
 Pharmacy: Dofetilide (Tikosyn) - Follow Up Assessment and Electrolyte Replacement  Pharmacy consulted to assist in monitoring and replacing electrolytes in this 63 y.o. male admitted on 09/13/2023 undergoing dofetilide initiation. First dofetilide dose: 500 mcg BID - 1st dose 6/3 PM  Labs:    Component Value Date/Time   K 4.2 09/16/2023 0451   MG 2.1 09/16/2023 0451     Plan: Potassium: K >/= 4: No additional supplementation needed  Magnesium: Mg > 2: No additional supplementation needed  Anticipate no need for KCl supp at discharge.  Thank you for allowing pharmacy to participate in this patient's care   Joanell Mowers, Davey Erp, [facility] Clinical Pharmacist  09/16/2023 8:01 AM   [facility] pharmacy phone numbers are listed on [email or web address]

## 2023-09-16 NOTE — Plan of Care (Signed)
  Problem: Education: Goal: Knowledge of disease or condition will improve Outcome: Completed/Met Goal: Understanding of medication regimen will improve Outcome: Completed/Met Goal: Individualized Educational Video(s) Outcome: Completed/Met   Problem: Activity: Goal: Ability to tolerate increased activity will improve Outcome: Completed/Met   Problem: Cardiac: Goal: Ability to achieve and maintain adequate cardiopulmonary perfusion will improve Outcome: Completed/Met   Problem: Health Behavior/Discharge Planning: Goal: Ability to safely manage health-related needs after discharge will improve Outcome: Completed/Met

## 2023-09-16 NOTE — Discharge Summary (Addendum)
 ELECTROPHYSIOLOGY PROCEDURE DISCHARGE SUMMARY    Patient ID: Jonathon Evans.,  MRN: [id number], DOB/AGE: 04-20-1960 63 y.o.  Admit date: 09/13/2023 Discharge date: 09/16/2023  Primary Care Physician: Jonathon Gold, MD  Primary Cardiologist: Dr. Junnie Evans Electrophysiologist: Dr. Marven Evans  Primary Discharge Diagnosis:  1.  persistent atrial fibrillation status post Tikosyn loading this admission      CHA2DS2Vasc is 3, on Eliquis   Secondary Discharge Diagnosis:  HTN OSA W/CPAP Stroke historically  No Known Allergies   Procedures This Admission:  1.  Tikosyn loading 2.  Direct current cardioversion on 09/15/23  by Dr Jonathon Evans which successfully restored SR.  There were no early apparent complications.   Brief HPI: Jonathon Evans. is a 63 y.o. male with a past medical history as noted above.  His followed by EP in the outpatient setting for atrial fibrillation.  Risks, benefits, and alternatives to Tikosyn were reviewed with the patient who wished to proceed.    Hospital Course:  The patient was admitted and Tikosyn was initiated.  Renal function and electrolytes were followed during the hospitalization.  The patient's QTc remained stable. Final EKG is reviewed with Dr. Marven Evans, manually measured QTc is shorter then machine read and given RBBB/QRS duration, QTc is stable.  On 09/14/13 the patient underwent direct current cardioversion which restored sinus rhythm.  He was monitored until discharge on telemetry which demonstrated AFib/flutter w/nocturnal rates 40's awake rates 50's his diltiazem  held > DCCV > SB/SR, nocturnal rates 50's awake rates 60's  On the day of discharge, he feels well, was examined by Dr Jonathon Evans who considered the patient stable for discharge to home.  Follow-up has been arranged with the AFib clinic in 1 week and with the EP team in 4 weeks.   Tikosyn teaching was completed No new or additional electrolyte replacement for home   Physical  Exam: Vitals:   09/15/23 1950 09/15/23 2301 09/16/23 0403 09/16/23 0731  BP: (!) 148/89 119/73 113/63 122/86  Pulse: 71 67 75   Resp: 20 18 20 18   Temp: 98 F (36.7 C) 98.3 F (36.8 C) (!) 97.4 F (36.3 C) 97.7 F (36.5 C)  TempSrc: Oral Oral Oral Oral  SpO2: 100% 99% 100%   Weight:      Height:         GEN- The patient is well appearing, alert and oriented x 3 today.   HEENT: normocephalic, atraumatic; sclera clear, conjunctiva pink; hearing intact; oropharynx clear; neck supple, no JVP Lymph- no cervical lymphadenopathy Lungs- CTA b/l, normal work of breathing.  No wheezes, rales, rhonchi Heart- RRR, no murmurs, rubs or gallops, PMI not laterally displaced GI- soft, non-tender, non-distended Extremities- no clubbing, cyanosis, or edema MS- no significant deformity or atrophy Skin- warm and dry, no rash or lesion Psych- euthymic mood, full affect Neuro- strength and sensation are intact   Labs:   Lab Results  Component Value Date   WBC 7.3 09/15/2023   HGB 16.0 09/15/2023   HCT 46.3 09/15/2023   MCV 84.5 09/15/2023   PLT 127 (L) 09/15/2023    Recent Labs  Lab 09/16/23 0451  NA 142  K 4.2  CL 105  CO2 26  BUN 15  CREATININE 1.20  CALCIUM  9.8  GLUCOSE 105*     Discharge Medications:  Allergies as of 09/16/2023   No Known Allergies      Medication List     STOP taking these medications    diltiazem  120 MG  24 hr capsule Commonly known as: CARDIZEM  CD       TAKE these medications    dofetilide 500 MCG capsule Commonly known as: TIKOSYN Take 1 capsule (500 mcg total) by mouth 2 (two) times daily.   Eliquis  5 MG Tabs tablet Generic drug: apixaban  Take 1 tablet (5 mg total) by mouth 2 (two) times daily.   FISH OIL PO Take 1 capsule by mouth daily.   losartan  25 MG tablet Commonly known as: COZAAR  Take 25 mg by mouth every evening.   multivitamin with minerals tablet Take 1 tablet by mouth daily.   omeprazole  20 MG capsule Commonly  known as: PRILOSEC Take 20 mg by mouth daily.   rosuvastatin  10 MG tablet Commonly known as: CRESTOR  Take 10 mg by mouth daily.   sildenafil 100 MG tablet Commonly known as: VIAGRA Take 100 mg by mouth daily as needed for erectile dysfunction.   Voltaren 1 % Gel Generic drug: diclofenac Sodium Apply 1 Application topically daily as needed (pain).        Disposition:  Discharge Instructions     Diet - low sodium heart healthy   Complete by: As directed    Increase activity slowly   Complete by: As directed         Duration of Discharge Encounter: 11 minutes, APP time.  [place], PA-C 09/16/2023 11:52 AM

## 2023-09-16 NOTE — Progress Notes (Signed)
 Post dose EKG and tele reviewed with Dr. Marven Slimmer QTc stable Plan to discontinue diltiazem  at discharge Continue Tikosyn load Anticipate discharge today  Mertha Abrahams, PA-C

## 2023-09-26 ENCOUNTER — Ambulatory Visit (HOSPITAL_COMMUNITY)
Admit: 2023-09-26 | Discharge: 2023-09-26 | Disposition: A | Discharge status: Home / Self Care | Attending: Internal Medicine | Admitting: Internal Medicine

## 2023-09-26 ENCOUNTER — Ambulatory Visit (HOSPITAL_COMMUNITY): Admitting: Physician Assistant

## 2023-09-26 VITALS — BP 116/80 | HR 98 | Ht 73.0 in | Wt 324.2 lb

## 2023-09-26 DIAGNOSIS — I4819 Other persistent atrial fibrillation: Secondary | ICD-10-CM

## 2023-09-26 DIAGNOSIS — D6869 Other thrombophilia: Secondary | ICD-10-CM | POA: Diagnosis not present

## 2023-09-26 DIAGNOSIS — I4891 Unspecified atrial fibrillation: Secondary | ICD-10-CM | POA: Diagnosis not present

## 2023-09-26 DIAGNOSIS — Z5181 Encounter for therapeutic drug level monitoring: Secondary | ICD-10-CM | POA: Diagnosis not present

## 2023-09-26 DIAGNOSIS — Z79899 Other long term (current) drug therapy: Secondary | ICD-10-CM | POA: Diagnosis not present

## 2023-09-26 MED ORDER — DOFETILIDE 500 MCG PO CAPS: 500.0000 ug | ORAL_CAPSULE | Freq: Two times a day (BID) | ORAL | 1 refills | Status: DC

## 2023-09-26 NOTE — Progress Notes (Signed)
 Primary Care Physician: Eartha Gold, MD Primary Cardiologist: Constancia Delton, MD Electrophysiologist: Boyce Byes, MD     Referring Physician: Suzann Riddle, NP     Jonathon Evans. is a 63 y.o. male with a history of HTN, CVA, atrial flutter and atrial fibrillation who presents for consultation in the [facility]. ERAF with Multaq . Patient is on Eliquis  5 mg BID for a CHADS2VASC score of 3.  On follow up 09/26/23, patient is here for 1 week Tikosyn  surveillance. S/p Tikosyn  admission 6/3-09/2023 with successful DCCV on 6/5. Discharged on Tikosyn  500 mcg BID. He has had no episodes of Afib since hospital discharge.   Today, he denies symptoms of palpitations, chest pain, shortness of breath, orthopnea, PND, lower extremity edema, dizziness, presyncope, syncope, snoring, daytime somnolence, bleeding, or neurologic sequela. The patient is tolerating medications without difficulties and is otherwise without complaint today.    he has a BMI of Body mass index is 42.77 kg/m.[name] Filed Weights   09/26/23 1344  Weight: (!) 147.1 kg     Current Outpatient Medications  Medication Sig Dispense Refill   apixaban  (ELIQUIS ) 5 MG TABS tablet Take 1 tablet (5 mg total) by mouth 2 (two) times daily. 180 tablet 2   diclofenac Sodium (VOLTAREN) 1 % GEL Apply 1 Application topically daily as needed (pain).     dofetilide  (TIKOSYN ) 500 MCG capsule Take 1 capsule (500 mcg total) by mouth 2 (two) times daily. 180 capsule 1   losartan  (COZAAR ) 25 MG tablet Take 25 mg by mouth every evening.     Multiple Vitamins-Minerals (MULTIVITAMIN WITH MINERALS) tablet Take 1 tablet by mouth daily.     Omega-3 Fatty Acids (FISH OIL PO) Take 1 capsule by mouth daily.     omeprazole  (PRILOSEC) 20 MG capsule Take 20 mg by mouth daily.     rosuvastatin  (CRESTOR ) 10 MG tablet Take 10 mg by mouth daily.     sildenafil (VIAGRA) 100 MG tablet Take 100 mg by mouth daily as needed  for erectile dysfunction.     No current facility-administered medications for this encounter.    Atrial Fibrillation Management history:  Previous antiarrhythmic drugs: Multaq , Tikosyn  Previous cardioversions: 08/03/23, 09/15/23 Previous ablations: none Anticoagulation history: Eliquis    ROS- All systems are reviewed and negative except as per the HPI above.  Physical Exam: BP 116/80   Pulse 98   Ht 6' 1 (1.854 m)   Wt (!) 147.1 kg   BMI 42.77 kg/m   GEN- The patient is well appearing, alert and oriented x 3 today.   Neck - no JVD or carotid bruit noted Lungs- Clear to ausculation bilaterally, normal work of breathing Heart- Regular rate and rhythm, no murmurs, rubs or gallops, PMI not laterally displaced Extremities- no clubbing, cyanosis, or edema Skin - no rash or ecchymosis noted   EKG today demonstrates  Vent. rate 98 BPM PR interval 168 ms QRS duration 144 ms QT/QTcB 372/475 ms P-R-T axes -16 -62 -7 Normal sinus rhythm Right bundle branch block Abnormal ECG When compared with ECG of 16-Sep-2023 10:57, similar to previous tracing Confirmed by [name] [phone]) on 09/26/2023 2:04:37 PM  Echo 04/25/23 demonstrated   1. Left ventricular ejection fraction, by estimation, is 55 to 60%. The  left ventricle has normal function. The left ventricle has no regional  wall motion abnormalities. Left ventricular diastolic function could not  be evaluated.   2. Right ventricular systolic function is normal. The right  ventricular  size is mildly enlarged. There is normal pulmonary artery systolic  pressure. The estimated right ventricular systolic pressure is 14.2 mmHg.   3. Left atrial size was severely dilated.   4. Right atrial size was mildly dilated.   5. The mitral valve is grossly normal. Trivial mitral valve  regurgitation. No evidence of mitral stenosis.   6. The aortic valve is tricuspid. There is mild calcification of the  aortic valve. Aortic valve  regurgitation is not visualized. No aortic  stenosis is present.   7. The inferior vena cava is normal in size with greater than 50%  respiratory variability, suggesting right atrial pressure of 3 mmHg.   ASSESSMENT & PLAN CHA2DS2-VASc Score = 3  The patient's score is based upon: CHF History: 0 HTN History: 1 Diabetes History: 0 Stroke History: 2 Vascular Disease History: 0 Age Score: 0 Gender Score: 0       ASSESSMENT AND PLAN: Persistent Atrial Fibrillation (ICD10:  I48.19) / atrial flutter The patient's CHA2DS2-VASc score is 3, indicating a 3.2% annual risk of stroke.   S/p Tikosyn  admission 6/3-09/2023. S/p DCCV on 6/5.   He is currently in NSR. He is doing well since hospital discharge and will continue Tikosyn  surveillance at [facility] location due to proximity to home.   High risk medication monitoring (ICD10: J342684) Patient requires ongoing monitoring for anti-arrhythmic medication which has the potential to cause life threatening arrhythmias or AV block. Qtc stable. Qtc corrected for RBBB.  Continue Tikosyn  500 mcg BID. Bmet and mag drawn today.  Secondary Hypercoagulable State (ICD10:  D68.69) The patient is at significant risk for stroke/thromboembolism based upon his CHA2DS2-VASc Score of 3.  Continue Apixaban  (Eliquis ).  No missed doses.    Follow up in 1 month for Tikosyn  surveillance in [place].   [name], PA-C  [facility] [address] [place], [place] [address] [phone]

## 2023-09-27 ENCOUNTER — Ambulatory Visit (HOSPITAL_COMMUNITY): Payer: Self-pay | Admitting: Internal Medicine

## 2023-09-27 LAB — BASIC METABOLIC PANEL WITH GFR
BUN/Creatinine Ratio: 11 (ref 10–24)
BUN: 12 mg/dL (ref 8–27)
CO2: 21 mmol/L (ref 20–29)
Calcium: 9.5 mg/dL (ref 8.6–10.2)
Chloride: 103 mmol/L (ref 96–106)
Creatinine, Ser: 1.13 mg/dL (ref 0.76–1.27)
Glucose: 77 mg/dL (ref 70–99)
Potassium: 4.2 mmol/L (ref 3.5–5.2)
Sodium: 142 mmol/L (ref 134–144)
eGFR: 73 mL/min/{1.73_m2} (ref >59)

## 2023-09-27 LAB — MAGNESIUM: Magnesium: 2 mg/dL (ref 1.6–2.3)

## 2023-10-10 DIAGNOSIS — G4733 Obstructive sleep apnea (adult) (pediatric): Secondary | ICD-10-CM | POA: Diagnosis not present

## 2023-10-12 DIAGNOSIS — G4733 Obstructive sleep apnea (adult) (pediatric): Secondary | ICD-10-CM | POA: Diagnosis not present

## 2023-10-16 NOTE — Progress Notes (Deleted)
 Electrophysiology Clinic Note    Date:  10/16/2023  Patient ID:  Jonathon Delpilar., DOB 11-Mar-1961, MRN [id number] PCP:  Epifanio Alm SQUIBB, MD  Cardiologist:  Redell Cave, MD Electrophysiologist: OLE ONEIDA HOLTS, MD   Discussed the use of AI scribe software for clinical note transcription with the patient, who gave verbal consent to proceed.   Patient Profile    Chief Complaint: Afib, tikosyn  follow-up  History of Present Illness: Jonathon Taseen Marasigan. is a 63 y.o. male with PMH notable for persis AFib, CVA, HTN, OSA on CPAP; seen today for OLE ONEIDA HOLTS, MD for routine electrophysiology followup.   He was last seen by Dr. HOLTS 08/2022 for consult of AFib. Chadsvasc was low at 0-1, so 81mg  ASA continued. Not ablation candidate d/t BMI.  He was admitted 04/2023 for a CVA, thought embolic iso AFib. He was discharged on eliquis .   He was recently admitted for tikosyn  loading 6/3-09/2023. He did require DCCV to convert to sinus rhythm. He saw PA Terra 6/16 where he was maintaining sinus rhythm.   On follow-up today, *** AF burden, symptoms *** palpitations *** bleeding concerns *** how taking tikosyn  *** look at med list for interactions   - needs labs   His wife joins for appt whom I have met before   AAD History: Multaq      ROS:  Please see the history of present illness. All other systems are reviewed and otherwise negative.    Physical Exam    VS:  There were no vitals taken for this visit. BMI: There is no height or weight on file to calculate BMI.  Wt Readings from Last 3 Encounters:  09/26/23 (!) 324 lb 3.2 oz (147.1 kg)  09/13/23 (!) 326 lb 11.6 oz (148.2 kg)  09/13/23 (!) 324 lb 12.8 oz (147.3 kg)    *** GEN- The patient is well appearing, alert and oriented x 3 today.   Lungs- Clear to ausculation bilaterally, normal work of breathing.  Heart- Irregularly irregular rate and rhythm, no murmurs, rubs or gallops Extremities- 1+ peripheral  edema, warm, dry    Studies Reviewed   Previous EP, cardiology notes.    EKG is ordered. Personal review of EKG from today shows        09/26/2023 EKG - SR at 98bpm, RBBB QRS - 144 QT - 372, QTCb  09/16/2023 EKG -  QTC  TTE, 04/25/2023  1. Left ventricular ejection fraction, by estimation, is 55 to 60%. The left ventricle has normal function. The left ventricle has no regional wall motion abnormalities. Left ventricular diastolic function could not be evaluated.   2. Right ventricular systolic function is normal. The right ventricular size is mildly enlarged. There is normal pulmonary artery systolic pressure. The estimated right ventricular systolic pressure is 14.2 mmHg.   3. Left atrial size was severely dilated.   4. Right atrial size was mildly dilated.   5. The mitral valve is grossly normal. Trivial mitral valve regurgitation. No evidence of mitral stenosis.   6. The aortic valve is tricuspid. There is mild calcification of the aortic valve. Aortic valve regurgitation is not visualized. No aortic stenosis is present.   7. The inferior vena cava is normal in size with greater than 50% respiratory variability, suggesting right atrial pressure of 3 mmHg.   Comparison(s): No significant change from prior study.   TTE, 08/26/2022  1. Left ventricular ejection fraction, by estimation, is 55 to 60%. The  left ventricle has normal function. The left ventricle has no regional wall motion abnormalities. Left ventricular diastolic parameters are indeterminate.   2. Right ventricular systolic function is mildly reduced. The right ventricular size is mildly enlarged. Tricuspid regurgitation signal is inadequate for assessing PA pressure.   3. Left atrial size was moderately dilated.   4. Right atrial size was moderately dilated.   5. The mitral valve is normal in structure. Mild to moderate mitral valve regurgitation. No evidence of mitral stenosis.   6. The aortic valve is normal  in structure. Aortic valve regurgitation is not visualized. No aortic stenosis is present.   7. There is borderline dilatation of the aortic root, measuring 39 mm. There is mild dilatation of the ascending aorta, measuring 42 mm. There is borderline dilatation of the aortic arch, measuring 36 mm.   8. The inferior vena cava is normal in size with greater than 50% respiratory variability, suggesting right atrial pressure of 3 mmHg.    Assessment and Plan     #) persis AFib #) aflutter S/p recent tikosyn  loading admission QTC stable at *** today Update BMP, mag today   #) Hypercoag d/t persis afib #) h/o CVA CHA2DS2-VASc Score = at least 3 [CHF History: 0, HTN History: 1, Diabetes History: 0, Stroke History: 2, Vascular Disease History: 0, Age Score: 0, Gender Score: 0].  Therefore, the patient's annual risk of stroke is 3.2 %.    Stroke ppx - 5mg  eliquis  BID, appropriately dosed No bleeding concerns            Current medicines are reviewed at length with the patient today.   The patient does not have concerns regarding his medicines.  The following changes were made today:   STOP multaq   Labs/ tests ordered today include:  No orders of the defined types were placed in this encounter.    Disposition: Follow up with Dr. Cindie or EP APP  after tikosyn  loading      Signed, Jonathon Needle, NP  10/16/23  6:04 PM  Electrophysiology [facility]

## 2023-10-17 ENCOUNTER — Ambulatory Visit: Admitting: Cardiology

## 2023-10-17 DIAGNOSIS — D6869 Other thrombophilia: Secondary | ICD-10-CM

## 2023-10-17 DIAGNOSIS — Z79899 Other long term (current) drug therapy: Secondary | ICD-10-CM

## 2023-10-17 DIAGNOSIS — I4892 Unspecified atrial flutter: Secondary | ICD-10-CM

## 2023-10-17 DIAGNOSIS — I4819 Other persistent atrial fibrillation: Secondary | ICD-10-CM

## 2023-10-25 NOTE — Progress Notes (Unsigned)
 Electrophysiology Clinic Note    Date:  10/26/2023  Patient ID:  Jonathon Imbert., DOB 1960/08/26, MRN [id number] PCP:  Epifanio Alm SQUIBB, MD  Cardiologist:  Redell Cave, MD Electrophysiologist: OLE ONEIDA HOLTS, MD   Discussed the use of AI scribe software for clinical note transcription with the patient, who gave verbal consent to proceed.   Patient Profile    Chief Complaint: Afib, tikosyn  follow-up  History of Present Illness: Jonathon Sladen Plancarte. is a 63 y.o. male with PMH notable for persis AFib, CVA, HTN, OSA on CPAP; seen today for OLE ONEIDA HOLTS, MD for routine electrophysiology followup.   He was last seen by Dr. HOLTS 08/2022 for consult of AFib. Chadsvasc was low at 0-1, so 81mg  ASA continued. Not ablation candidate d/t BMI.  He was admitted 04/2023 for a CVA, thought embolic iso AFib. He was discharged on eliquis .   He was recently admitted for tikosyn  loading 6/3-09/2023. He did require DCCV to convert to sinus rhythm. He saw PA Terra 6/16 where he was maintaining sinus rhythm.   On follow-up today, he does endorse feeling better since starting tikosyn . Is not able to verbalize specifically how he feels better. He is very diligent about taking tikosyn  q12h, uses alarms, wife, and even coworkers help to remind him. He keeps a couple pills in his pocket in case he is out of the house.  Continues to take eliquis  with his tikosyn , no bleeding issues.   He has continued his low-fat diet since his cholelithiasis flare earlier this spring, no further issues.  He is having intermittent constipation, and questions what he can take OTC.      AAD History: Multaq  - ineffective Tikosyn  - loaded 09/2023    ROS:  Please see the history of present illness. All other systems are reviewed and otherwise negative.    Physical Exam    VS:  BP 108/70   Pulse 99   Ht 6' 1 (1.854 m)   Wt (!) 323 lb (146.5 kg)   SpO2 94%   BMI 42.61 kg/m  BMI: Body mass index is  42.61 kg/m.  Wt Readings from Last 3 Encounters:  10/26/23 (!) 323 lb (146.5 kg)  09/26/23 (!) 324 lb 3.2 oz (147.1 kg)  09/13/23 (!) 326 lb 11.6 oz (148.2 kg)     GEN- The patient is well appearing, alert and oriented x 3 today.   Lungs- Clear to ausculation bilaterally, normal work of breathing.  Heart- Regular rate and rhythm, no murmurs, rubs or gallops Extremities- Trace peripheral edema, warm, dry   Studies Reviewed   Previous EP, cardiology notes.    EKG is ordered. Personal review of EKG from today shows   EKG Interpretation Date/Time:  Wednesday October 26 2023 13:53:27 EDT Ventricular Rate:  99 PR Interval:  148 QRS Duration:  142 QT Interval:  374 QTC Calculation: 480 R Axis:   -30  Text Interpretation: Normal sinus rhythm Left axis deviation Right bundle branch block Confirmed by Jonathon Evans [phone]) on 10/26/2023 2:23:49 PM Also confirmed by Jonathon Evans [phone])  on 10/26/2023 2:44:02 PM    09/26/2023 EKG - SR at 98bpm, RBBB QRS - 144 QT - 372, QTCb  09/16/2023 EKG -  QTC  TTE, 04/25/2023  1. Left ventricular ejection fraction, by estimation, is 55 to 60%. The left ventricle has normal function. The left ventricle has no regional wall motion abnormalities. Left ventricular diastolic function could not be evaluated.  2. Right ventricular systolic function is normal. The right ventricular size is mildly enlarged. There is normal pulmonary artery systolic pressure. The estimated right ventricular systolic pressure is 14.2 mmHg.   3. Left atrial size was severely dilated.   4. Right atrial size was mildly dilated.   5. The mitral valve is grossly normal. Trivial mitral valve regurgitation. No evidence of mitral stenosis.   6. The aortic valve is tricuspid. There is mild calcification of the aortic valve. Aortic valve regurgitation is not visualized. No aortic stenosis is present.   7. The inferior vena cava is normal in size with greater than 50%  respiratory variability, suggesting right atrial pressure of 3 mmHg.   Comparison(s): No significant change from prior study.   TTE, 08/26/2022  1. Left ventricular ejection fraction, by estimation, is 55 to 60%. The left ventricle has normal function. The left ventricle has no regional wall motion abnormalities. Left ventricular diastolic parameters are indeterminate.   2. Right ventricular systolic function is mildly reduced. The right ventricular size is mildly enlarged. Tricuspid regurgitation signal is inadequate for assessing PA pressure.   3. Left atrial size was moderately dilated.   4. Right atrial size was moderately dilated.   5. The mitral valve is normal in structure. Mild to moderate mitral valve regurgitation. No evidence of mitral stenosis.   6. The aortic valve is normal in structure. Aortic valve regurgitation is not visualized. No aortic stenosis is present.   7. There is borderline dilatation of the aortic root, measuring 39 mm. There is mild dilatation of the ascending aorta, measuring 42 mm. There is borderline dilatation of the aortic arch, measuring 36 mm.   8. The inferior vena cava is normal in size with greater than 50% respiratory variability, suggesting right atrial pressure of 3 mmHg.    Assessment and Plan     #) persis AFib #) aflutter S/p recent tikosyn  loading admission Maintaining sinus rhythm QTC stable at today Update BMP, mag today  #) Hypercoag d/t persis afib #) h/o CVA CHA2DS2-VASc Score = at least 3 [CHF History: 0, HTN History: 1, Diabetes History: 0, Stroke History: 2, Vascular Disease History: 0, Age Score: 0, Gender Score: 0].  Therefore, the patient's annual risk of stroke is 3.2 %.    Stroke ppx - 5mg  eliquis  BID, appropriately dosed No bleeding concerns  #) Constipation Reports changes in bowel habits with less regularity. - Start Colace, Miralax, or Dulcolax as needed. - Ensure adequate hydration.          Current  medicines are reviewed at length with the patient today.   The patient does not have concerns regarding his medicines.  The following changes were made today:   none  Labs/ tests ordered today include:  Orders Placed This Encounter  Procedures   Basic metabolic panel with GFR   Magnesium    EKG 12-Lead     Disposition: Follow up with Dr. Cindie or EP APP  in 3 months     Signed, Jonathon Wescott, NP  10/26/23  3:54 PM  Electrophysiology [facility]

## 2023-10-26 ENCOUNTER — Encounter: Payer: Self-pay | Admitting: Cardiology

## 2023-10-26 ENCOUNTER — Ambulatory Visit: Attending: Cardiology | Admitting: Cardiology

## 2023-10-26 VITALS — BP 108/70 | HR 99 | Ht 73.0 in | Wt 323.0 lb

## 2023-10-26 DIAGNOSIS — Z79899 Other long term (current) drug therapy: Secondary | ICD-10-CM | POA: Diagnosis not present

## 2023-10-26 DIAGNOSIS — I4819 Other persistent atrial fibrillation: Secondary | ICD-10-CM

## 2023-10-26 DIAGNOSIS — I639 Cerebral infarction, unspecified: Secondary | ICD-10-CM

## 2023-10-26 DIAGNOSIS — D6869 Other thrombophilia: Secondary | ICD-10-CM

## 2023-10-26 DIAGNOSIS — Z5181 Encounter for therapeutic drug level monitoring: Secondary | ICD-10-CM | POA: Diagnosis not present

## 2023-10-26 NOTE — Patient Instructions (Signed)
 Medication Instructions:   Your physician recommends that you continue on your current medications as directed. Please refer to the Current Medication list given to you today.   *If you need a refill on your cardiac medications before your next appointment, please call your pharmacy*  Lab Work: Your provider would like for you to have following labs drawn today BMP and Mag.   If you have labs (blood work) drawn today and your tests are completely normal, you will receive your results only by: MyChart Message (if you have MyChart) OR A paper copy in the mail If you have any lab test that is abnormal or we need to change your treatment, we will call you to review the results.  Testing/Procedures: No test ordered today   Follow-Up: At [facility], you and your health needs are our priority.  As part of our continuing mission to provide you with exceptional heart care, our providers are all part of one team.  This team includes your primary Cardiologist (physician) and Advanced Practice Providers or APPs (Physician Assistants and Nurse Practitioners) who all work together to provide you with the care you need, when you need it.  Your next appointment:   3 month(s)  Provider:   Ole Holts, MD or Suzann Riddle, NP    We recommend signing up for the patient portal called MyChart.  Sign up information is provided on this After Visit Summary.  MyChart is used to connect with patients for Virtual Visits (Telemedicine).  Patients are able to view lab/test results, encounter notes, upcoming appointments, etc.  Non-urgent messages can be sent to your provider as well.   To learn more about what you can do with MyChart, go to [email or web address].

## 2023-10-27 ENCOUNTER — Ambulatory Visit: Payer: Self-pay | Admitting: Cardiology

## 2023-10-27 LAB — BASIC METABOLIC PANEL WITH GFR
BUN/Creatinine Ratio: 11 (ref 10–24)
BUN: 13 mg/dL (ref 8–27)
CO2: 22 mmol/L (ref 20–29)
Calcium: 9.6 mg/dL (ref 8.6–10.2)
Chloride: 101 mmol/L (ref 96–106)
Creatinine, Ser: 1.16 mg/dL (ref 0.76–1.27)
Glucose: 67 mg/dL — ABNORMAL LOW (ref 70–99)
Potassium: 4.7 mmol/L (ref 3.5–5.2)
Sodium: 142 mmol/L (ref 134–144)
eGFR: 70 mL/min/1.73 (ref >59)

## 2023-10-27 LAB — MAGNESIUM: Magnesium: 2.2 mg/dL (ref 1.6–2.3)

## 2023-11-08 DIAGNOSIS — H26493 Other secondary cataract, bilateral: Secondary | ICD-10-CM | POA: Diagnosis not present

## 2023-11-08 DIAGNOSIS — H43811 Vitreous degeneration, right eye: Secondary | ICD-10-CM | POA: Diagnosis not present

## 2023-11-08 DIAGNOSIS — Z961 Presence of intraocular lens: Secondary | ICD-10-CM | POA: Diagnosis not present

## 2023-11-23 ENCOUNTER — Ambulatory Visit: Admitting: Physician Assistant

## 2023-12-05 DIAGNOSIS — K8020 Calculus of gallbladder without cholecystitis without obstruction: Secondary | ICD-10-CM | POA: Diagnosis not present

## 2023-12-05 DIAGNOSIS — I639 Cerebral infarction, unspecified: Secondary | ICD-10-CM | POA: Diagnosis not present

## 2023-12-05 DIAGNOSIS — R7303 Prediabetes: Secondary | ICD-10-CM | POA: Diagnosis not present

## 2023-12-05 DIAGNOSIS — I4891 Unspecified atrial fibrillation: Secondary | ICD-10-CM | POA: Diagnosis not present

## 2023-12-05 DIAGNOSIS — Z125 Encounter for screening for malignant neoplasm of prostate: Secondary | ICD-10-CM | POA: Diagnosis not present

## 2023-12-05 DIAGNOSIS — E66813 Obesity, class 3: Secondary | ICD-10-CM | POA: Diagnosis not present

## 2023-12-05 DIAGNOSIS — Z6841 Body Mass Index (BMI) 40.0 and over, adult: Secondary | ICD-10-CM | POA: Diagnosis not present

## 2023-12-05 DIAGNOSIS — I10 Essential (primary) hypertension: Secondary | ICD-10-CM | POA: Diagnosis not present

## 2023-12-05 DIAGNOSIS — E785 Hyperlipidemia, unspecified: Secondary | ICD-10-CM | POA: Diagnosis not present

## 2023-12-06 DIAGNOSIS — H26493 Other secondary cataract, bilateral: Secondary | ICD-10-CM | POA: Diagnosis not present

## 2024-01-26 ENCOUNTER — Ambulatory Visit: Attending: Cardiology | Admitting: Cardiology

## 2024-01-26 VITALS — BP 102/78 | HR 73 | Ht 73.0 in | Wt 322.8 lb

## 2024-01-26 DIAGNOSIS — D6869 Other thrombophilia: Secondary | ICD-10-CM | POA: Diagnosis not present

## 2024-01-26 DIAGNOSIS — I4819 Other persistent atrial fibrillation: Secondary | ICD-10-CM | POA: Diagnosis not present

## 2024-01-26 DIAGNOSIS — Z79899 Other long term (current) drug therapy: Secondary | ICD-10-CM

## 2024-01-26 DIAGNOSIS — Z5181 Encounter for therapeutic drug level monitoring: Secondary | ICD-10-CM

## 2024-01-26 NOTE — Progress Notes (Signed)
 Electrophysiology Clinic Note    Date:  01/26/2024  Patient ID:  Jonathon Pandya., DOB 1960-09-05, MRN [id number] PCP:  Epifanio Alm SQUIBB, MD  Cardiologist:  Redell Cave, MD  Electrophysiologist:  OLE ONEIDA HOLTS, MD  Electrophysiology APP:  Cooper Moroney, NP     Discussed the use of AI scribe software for clinical note transcription with the patient, who gave verbal consent to proceed.   Patient Profile    Chief Complaint: AFib, tikosyn  follow-up  History of Present Illness: Jonathon Evans. is a 63 y.o. male with PMH notable for persis AFib, CVA, HTN, OSA on CPAP ; seen today for OLE ONEIDA HOLTS, MD for routine electrophysiology followup.   He was last seen by Dr. HOLTS 08/2022 for consult of AFib. Chadsvasc was low at 0-1, so 81mg  ASA continued. Not ablation candidate d/t BMI.  He was admitted 04/2023 for a CVA, thought embolic iso AFib. He was discharged on eliquis .   He was admitted 09/2023 for tikosyn  loading.   I last saw him 10/2023 where he was maintaining sinus rhythm.   On follow-up today, he has no cadiac complaints. He is not aware of any arrhythmia episodes, historically asymptomatic of afib. He continues tot ake tikosyn  every 12 h (6am/pm). The evening doses are challenging to remember, he uses alarms to remember.  Continues to take eliquis , no bleeding concerns.   He is considering having his gallbladder removed in the near future, questions how to hold blood thinners for this. He also needs dental work done.      Arrhythmia/Device History Multaq  - ineffective Tiksoyn - loaded 09/2023    ROS:  Please see the history of present illness. All other systems are reviewed and otherwise negative.    Physical Exam    VS:  BP 102/78 (BP Location: Left Arm, Patient Position: Sitting, Cuff Size: Large)   Pulse 73   Ht 6' 1 (1.854 m)   Wt (!) 322 lb 12.8 oz (146.4 kg)   SpO2 96%   BMI 42.59 kg/m  BMI: Body mass index is 42.59 kg/m.            Wt Readings from Last 3 Encounters:  01/26/24 (!) 322 lb 12.8 oz (146.4 kg)  10/26/23 (!) 323 lb (146.5 kg)  09/26/23 (!) 324 lb 3.2 oz (147.1 kg)     GEN- The patient is well appearing, alert and oriented x 3 today.   Lungs- Clear to ausculation bilaterally, normal work of breathing.  Heart- Regular rate and rhythm, no murmurs, rubs or gallops Extremities- Trace peripheral edema, warm, dry   Studies Reviewed   Previous EP, cardiology notes.    EKG is ordered. Personal review of EKG from today shows:    EKG Interpretation Date/Time:  Thursday January 26 2024 14:09:38 EDT Ventricular Rate:  73 PR Interval:  154 QRS Duration:  152 QT Interval:  434 QTC Calculation: 478 R Axis:   -39  Text Interpretation: Normal sinus rhythm Left axis deviation Right bundle branch block Confirmed by Jonathon Evans [phone]) on 01/26/2024 2:22:25 PM    10/2023 EKG - SR at 99, LAD, RBBB; QTC 480  09/2023 EKG - SR at 98, RBBB; QTC 475   TTE, 04/25/2023  1. Left ventricular ejection fraction, by estimation, is 55 to 60%. The left ventricle has normal function. The left ventricle has no regional wall motion abnormalities. Left ventricular diastolic function could not be evaluated.   2. Right ventricular systolic function is  normal. The right ventricular size is mildly enlarged. There is normal pulmonary artery systolic pressure. The estimated right ventricular systolic pressure is 14.2 mmHg.   3. Left atrial size was severely dilated.   4. Right atrial size was mildly dilated.   5. The mitral valve is grossly normal. Trivial mitral valve regurgitation. No evidence of mitral stenosis.   6. The aortic valve is tricuspid. There is mild calcification of the aortic valve. Aortic valve regurgitation is not visualized. No aortic stenosis is present.   7. The inferior vena cava is normal in size with greater than 50% respiratory variability, suggesting right atrial pressure of 3 mmHg.   Comparison(s): No  significant change from prior study.    TTE, 08/26/2022  1. Left ventricular ejection fraction, by estimation, is 55 to 60%. The left ventricle has normal function. The left ventricle has no regional wall motion abnormalities. Left ventricular diastolic parameters are indeterminate.   2. Right ventricular systolic function is mildly reduced. The right ventricular size is mildly enlarged. Tricuspid regurgitation signal is inadequate for assessing PA pressure.   3. Left atrial size was moderately dilated.   4. Right atrial size was moderately dilated.   5. The mitral valve is normal in structure. Mild to moderate mitral valve regurgitation. No evidence of mitral stenosis.   6. The aortic valve is normal in structure. Aortic valve regurgitation is not visualized. No aortic stenosis is present.   7. There is borderline dilatation of the aortic root, measuring 39 mm. There is mild dilatation of the ascending aorta, measuring 42 mm. There is borderline dilatation of the aortic arch, measuring 36 mm.   8. The inferior vena cava is normal in size with greater than 50% respiratory variability, suggesting right atrial pressure of 3 mmHg.     Assessment and Plan     #) persis AFib #) aflutter On tikosyn , loaded earlier this year Maintaining sinus rhythm with stable QTC Continue tikosyn  q12h, continue to use alarms to remember Update BMP, mag today  #) Hypercoag d/t afib CHA2DS2-VASc Score = at least 3 [CHF History: 0, HTN History: 1, Diabetes History: 0, Stroke History: 2, Vascular Disease History: 0, Age Score: 0, Gender Score: 0].  Therefore, the patient's annual risk of stroke is 3.2 %.    Stroke ppx - 5mg  eliquis  BID, appropriately dosed No bleeding concerns We discussed the pre-op team and that his surgeon and dental provider should reach out when procedure plans are known to obtain clearance and recommendations regarding holding OAC.          Current medicines are reviewed at  length with the patient today.   The patient has concerns regarding his medicines.  The following changes were made today:  none  Labs/ tests ordered today include:  Orders Placed This Encounter  Procedures   Basic metabolic panel with GFR   Magnesium    EKG 12-Lead     Disposition: Follow up with Dr. Cindie or EP APP 3-4 months    Signed, Jonathon Needle, NP  01/26/24  3:50 PM  Electrophysiology [facility]

## 2024-01-26 NOTE — Patient Instructions (Signed)
 Medication Instructions:  Your physician recommends that you continue on your current medications as directed. Please refer to the Current Medication list given to you today.   *If you need a refill on your cardiac medications before your next appointment, please call your pharmacy*  Lab Work: Your provider would like for you to have following labs drawn today BMP, Mag.   If you have labs (blood work) drawn today and your tests are completely normal, you will receive your results only by: MyChart Message (if you have MyChart) OR A paper copy in the mail If you have any lab test that is abnormal or we need to change your treatment, we will call you to review the results.  Testing/Procedures: No test ordered today   Follow-Up: At [facility], you and your health needs are our priority.  As part of our continuing mission to provide you with exceptional heart care, our providers are all part of one team.  This team includes your primary Cardiologist (physician) and Advanced Practice Providers or APPs (Physician Assistants and Nurse Practitioners) who all work together to provide you with the care you need, when you need it.  Your next appointment:   4 month(s)  Provider:   Suzann Riddle, NP    We recommend signing up for the patient portal called MyChart.  Sign up information is provided on this After Visit Summary.  MyChart is used to connect with patients for Virtual Visits (Telemedicine).  Patients are able to view lab/test results, encounter notes, upcoming appointments, etc.  Non-urgent messages can be sent to your provider as well.   To learn more about what you can do with MyChart, go to [email or web address].

## 2024-01-27 ENCOUNTER — Ambulatory Visit: Payer: Self-pay | Admitting: Cardiology

## 2024-01-27 LAB — BASIC METABOLIC PANEL WITH GFR
BUN/Creatinine Ratio: 13 (ref 10–24)
BUN: 14 mg/dL (ref 8–27)
CO2: 22 mmol/L (ref 20–29)
Calcium: 9.4 mg/dL (ref 8.6–10.2)
Chloride: 103 mmol/L (ref 96–106)
Creatinine, Ser: 1.12 mg/dL (ref 0.76–1.27)
Glucose: 83 mg/dL (ref 70–99)
Potassium: 4.5 mmol/L (ref 3.5–5.2)
Sodium: 143 mmol/L (ref 134–144)
eGFR: 73 mL/min/1.73 (ref >59)

## 2024-01-27 LAB — MAGNESIUM: Magnesium: 2.1 mg/dL (ref 1.6–2.3)

## 2024-01-31 DIAGNOSIS — L578 Other skin changes due to chronic exposure to nonionizing radiation: Secondary | ICD-10-CM | POA: Diagnosis not present

## 2024-02-01 ENCOUNTER — Other Ambulatory Visit (HOSPITAL_COMMUNITY): Payer: Self-pay | Admitting: Internal Medicine

## 2024-02-04 ENCOUNTER — Other Ambulatory Visit: Payer: Self-pay | Admitting: Physician Assistant

## 2024-02-04 DIAGNOSIS — E785 Hyperlipidemia, unspecified: Secondary | ICD-10-CM

## 2024-02-04 DIAGNOSIS — I10 Essential (primary) hypertension: Secondary | ICD-10-CM

## 2024-02-04 DIAGNOSIS — E782 Mixed hyperlipidemia: Secondary | ICD-10-CM

## 2024-02-07 DIAGNOSIS — E785 Hyperlipidemia, unspecified: Secondary | ICD-10-CM

## 2024-02-07 DIAGNOSIS — I10 Essential (primary) hypertension: Secondary | ICD-10-CM

## 2024-02-07 DIAGNOSIS — E782 Mixed hyperlipidemia: Secondary | ICD-10-CM

## 2024-02-07 MED ORDER — LOSARTAN POTASSIUM 25 MG PO TABS: 25.0000 mg | ORAL_TABLET | Freq: Every day | ORAL | 1 refills | Status: AC

## 2024-03-26 DIAGNOSIS — E66813 Obesity, class 3: Secondary | ICD-10-CM | POA: Diagnosis not present

## 2024-03-26 DIAGNOSIS — R7303 Prediabetes: Secondary | ICD-10-CM | POA: Diagnosis not present

## 2024-03-26 DIAGNOSIS — I4891 Unspecified atrial fibrillation: Secondary | ICD-10-CM | POA: Diagnosis not present

## 2024-03-26 DIAGNOSIS — Z125 Encounter for screening for malignant neoplasm of prostate: Secondary | ICD-10-CM | POA: Diagnosis not present

## 2024-03-26 DIAGNOSIS — Z6841 Body Mass Index (BMI) 40.0 and over, adult: Secondary | ICD-10-CM | POA: Diagnosis not present

## 2024-03-26 DIAGNOSIS — E785 Hyperlipidemia, unspecified: Secondary | ICD-10-CM | POA: Diagnosis not present

## 2024-03-26 DIAGNOSIS — I10 Essential (primary) hypertension: Secondary | ICD-10-CM | POA: Diagnosis not present

## 2024-03-26 DIAGNOSIS — K8020 Calculus of gallbladder without cholecystitis without obstruction: Secondary | ICD-10-CM | POA: Diagnosis not present

## 2024-03-26 DIAGNOSIS — I639 Cerebral infarction, unspecified: Secondary | ICD-10-CM | POA: Diagnosis not present

## 2024-04-08 ENCOUNTER — Other Ambulatory Visit (HOSPITAL_COMMUNITY): Payer: Self-pay | Admitting: Internal Medicine

## 2024-04-26 ENCOUNTER — Ambulatory Visit: Payer: Self-pay

## 2024-04-26 DIAGNOSIS — Z0110 Encounter for examination of ears and hearing without abnormal findings: Secondary | ICD-10-CM

## 2024-04-26 NOTE — Progress Notes (Signed)
 Jonathon Evans goes to outside provider for his annual physical.  Works in [employer] & the department is part of the [place] [employer] program.  Presents to [facility] today for annual hearing screen.  Right ear WNL but show early warning for potential STS. Left ear WNL except has moderate deficit in high pitch hearing.

## 2024-04-27 ENCOUNTER — Ambulatory Visit: Payer: Self-pay

## 2024-05-14 ENCOUNTER — Inpatient Hospital Stay: Admission: RE | Admit: 2024-05-14

## 2024-05-30 ENCOUNTER — Ambulatory Visit: Admitting: Cardiology
